# Patient Record
Sex: Female | Born: 1960 | Hispanic: No | Marital: Single | Smoking: Former smoker
Health system: Southern US, Community
[De-identification: ages and names within clinical notes are randomized; demographics above are authoritative.]

## PROBLEM LIST (undated history)

## (undated) DIAGNOSIS — E119 Type 2 diabetes mellitus without complications: Secondary | ICD-10-CM

## (undated) DIAGNOSIS — F329 Major depressive disorder, single episode, unspecified: Secondary | ICD-10-CM

## (undated) DIAGNOSIS — E039 Hypothyroidism, unspecified: Secondary | ICD-10-CM

## (undated) DIAGNOSIS — J45909 Unspecified asthma, uncomplicated: Secondary | ICD-10-CM

## (undated) DIAGNOSIS — F32A Depression, unspecified: Secondary | ICD-10-CM

## (undated) DIAGNOSIS — K589 Irritable bowel syndrome without diarrhea: Secondary | ICD-10-CM

## (undated) DIAGNOSIS — F419 Anxiety disorder, unspecified: Secondary | ICD-10-CM

## (undated) DIAGNOSIS — I10 Essential (primary) hypertension: Secondary | ICD-10-CM

## (undated) DIAGNOSIS — R519 Headache, unspecified: Secondary | ICD-10-CM

## (undated) DIAGNOSIS — F4310 Post-traumatic stress disorder, unspecified: Secondary | ICD-10-CM

## (undated) DIAGNOSIS — R51 Headache: Secondary | ICD-10-CM

## (undated) DIAGNOSIS — T63331A Toxic effect of venom of brown recluse spider, accidental (unintentional), initial encounter: Secondary | ICD-10-CM

## (undated) HISTORY — PX: BACK SURGERY: SHX140

## (undated) HISTORY — DX: Essential (primary) hypertension: I10

## (undated) HISTORY — PX: TONSILLECTOMY: SUR1361

## (undated) HISTORY — PX: ABDOMINAL HYSTERECTOMY: SHX81

## (undated) HISTORY — PX: SPINAL FUSION: SHX223

## (undated) HISTORY — DX: Type 2 diabetes mellitus without complications: E11.9

---

## 2006-05-26 ENCOUNTER — Ambulatory Visit: Payer: Self-pay

## 2006-11-18 ENCOUNTER — Ambulatory Visit: Payer: Self-pay

## 2006-11-18 ENCOUNTER — Other Ambulatory Visit: Payer: Self-pay

## 2006-11-19 ENCOUNTER — Ambulatory Visit: Payer: Self-pay

## 2008-03-14 ENCOUNTER — Encounter: Admission: RE | Admit: 2008-03-14 | Discharge: 2008-03-14 | Payer: Self-pay | Admitting: Internal Medicine

## 2008-06-15 ENCOUNTER — Ambulatory Visit: Payer: Self-pay | Admitting: Family Medicine

## 2009-02-14 ENCOUNTER — Ambulatory Visit: Payer: Self-pay | Admitting: Obstetrics & Gynecology

## 2009-02-15 ENCOUNTER — Ambulatory Visit: Payer: Self-pay | Admitting: Obstetrics & Gynecology

## 2009-09-22 ENCOUNTER — Observation Stay: Payer: Self-pay | Admitting: Internal Medicine

## 2009-10-09 ENCOUNTER — Ambulatory Visit: Payer: Self-pay | Admitting: Family Medicine

## 2010-01-25 ENCOUNTER — Ambulatory Visit (HOSPITAL_COMMUNITY): Admission: RE | Admit: 2010-01-25 | Discharge: 2010-01-26 | Payer: Self-pay | Admitting: Neurological Surgery

## 2010-02-25 ENCOUNTER — Encounter: Admission: RE | Admit: 2010-02-25 | Discharge: 2010-02-25 | Payer: Self-pay | Admitting: Neurological Surgery

## 2010-03-09 ENCOUNTER — Emergency Department: Payer: Self-pay | Admitting: Emergency Medicine

## 2010-06-25 ENCOUNTER — Ambulatory Visit: Payer: Self-pay | Admitting: Otolaryngology

## 2010-07-16 ENCOUNTER — Other Ambulatory Visit: Payer: Self-pay | Admitting: Neurological Surgery

## 2010-07-16 ENCOUNTER — Ambulatory Visit
Admission: RE | Admit: 2010-07-16 | Discharge: 2010-07-16 | Disposition: A | Discharge status: Home / Self Care | Source: Ambulatory Visit | Attending: Neurological Surgery | Admitting: Neurological Surgery

## 2010-07-16 DIAGNOSIS — Z9889 Other specified postprocedural states: Secondary | ICD-10-CM

## 2010-07-25 LAB — BASIC METABOLIC PANEL
CO2: 28 mEq/L (ref 19–32)
Chloride: 105 mEq/L (ref 96–112)
Glucose, Bld: 122 mg/dL — ABNORMAL HIGH (ref 70–99)
Potassium: 4.3 mEq/L (ref 3.5–5.1)
Sodium: 140 mEq/L (ref 135–145)

## 2010-07-25 LAB — CBC
HCT: 41.7 % (ref 36.0–46.0)
Hemoglobin: 13.9 g/dL (ref 12.0–15.0)
MCH: 28.1 pg (ref 26.0–34.0)
MCHC: 33.3 g/dL (ref 30.0–36.0)

## 2010-07-25 LAB — GLUCOSE, CAPILLARY
Glucose-Capillary: 122 mg/dL — ABNORMAL HIGH (ref 70–99)
Glucose-Capillary: 135 mg/dL — ABNORMAL HIGH (ref 70–99)
Glucose-Capillary: 309 mg/dL — ABNORMAL HIGH (ref 70–99)

## 2010-07-25 LAB — PROTIME-INR: INR: 0.94 (ref 0.00–1.49)

## 2010-07-25 LAB — SURGICAL PCR SCREEN
MRSA, PCR: NEGATIVE
Staphylococcus aureus: NEGATIVE

## 2010-08-13 ENCOUNTER — Ambulatory Visit: Payer: Self-pay | Admitting: Family Medicine

## 2010-12-02 ENCOUNTER — Ambulatory Visit
Admission: RE | Admit: 2010-12-02 | Discharge: 2010-12-02 | Disposition: A | Discharge status: Home / Self Care | Source: Ambulatory Visit | Attending: Neurological Surgery | Admitting: Neurological Surgery

## 2010-12-02 ENCOUNTER — Other Ambulatory Visit: Payer: Self-pay | Admitting: Neurological Surgery

## 2010-12-02 DIAGNOSIS — M4712 Other spondylosis with myelopathy, cervical region: Secondary | ICD-10-CM

## 2010-12-02 DIAGNOSIS — M4802 Spinal stenosis, cervical region: Secondary | ICD-10-CM

## 2011-09-02 ENCOUNTER — Observation Stay: Payer: Self-pay | Admitting: Internal Medicine

## 2011-09-02 LAB — URINALYSIS, COMPLETE
Bacteria: NONE SEEN
Bilirubin,UR: NEGATIVE
Blood: NEGATIVE
Leukocyte Esterase: NEGATIVE
Nitrite: NEGATIVE
Ph: 7 (ref 4.5–8.0)
Protein: NEGATIVE
RBC,UR: 1 /HPF (ref 0–5)
Specific Gravity: 1.006 (ref 1.003–1.030)
Squamous Epithelial: 1

## 2011-09-02 LAB — COMPREHENSIVE METABOLIC PANEL
Anion Gap: 8 (ref 7–16)
Bilirubin,Total: 0.4 mg/dL (ref 0.2–1.0)
Calcium, Total: 8.7 mg/dL (ref 8.5–10.1)
Creatinine: 0.91 mg/dL (ref 0.60–1.30)
EGFR (African American): >60
Osmolality: 283 (ref 275–301)
SGPT (ALT): 24 U/L
Sodium: 140 mmol/L (ref 136–145)
Total Protein: 7.7 g/dL (ref 6.4–8.2)

## 2011-09-02 LAB — CBC
HCT: 45.2 % (ref 35.0–47.0)
MCHC: 32.3 g/dL (ref 32.0–36.0)

## 2011-09-02 LAB — CK-MB: CK-MB: 0.7 ng/mL (ref 0.5–3.6)

## 2011-09-02 LAB — CK: CK, Total: 46 U/L (ref 21–215)

## 2011-09-03 DIAGNOSIS — G459 Transient cerebral ischemic attack, unspecified: Secondary | ICD-10-CM

## 2011-09-03 LAB — CK-MB
CK-MB: 0.6 ng/mL (ref 0.5–3.6)
CK-MB: <0.5 ng/mL — ABNORMAL LOW (ref 0.5–3.6)

## 2011-09-03 LAB — TROPONIN I: Troponin-I: <0.02 ng/mL

## 2011-09-03 LAB — CK
CK, Total: 43 U/L (ref 21–215)
CK, Total: 44 U/L (ref 21–215)

## 2011-10-28 ENCOUNTER — Ambulatory Visit: Payer: Self-pay | Admitting: Neurology

## 2012-03-03 IMAGING — CR DG CHEST 1V PORT
1 series · 1 of 1 positions shown · non-contrast
Comparison: none

REASON FOR EXAM: cva
COMMENTS:

PROCEDURE:     DXR - DXR PORTABLE CHEST SINGLE VIEW  - September 22, 2009 [DATE]
RESULT:     Comparison: None

[view not recorded]
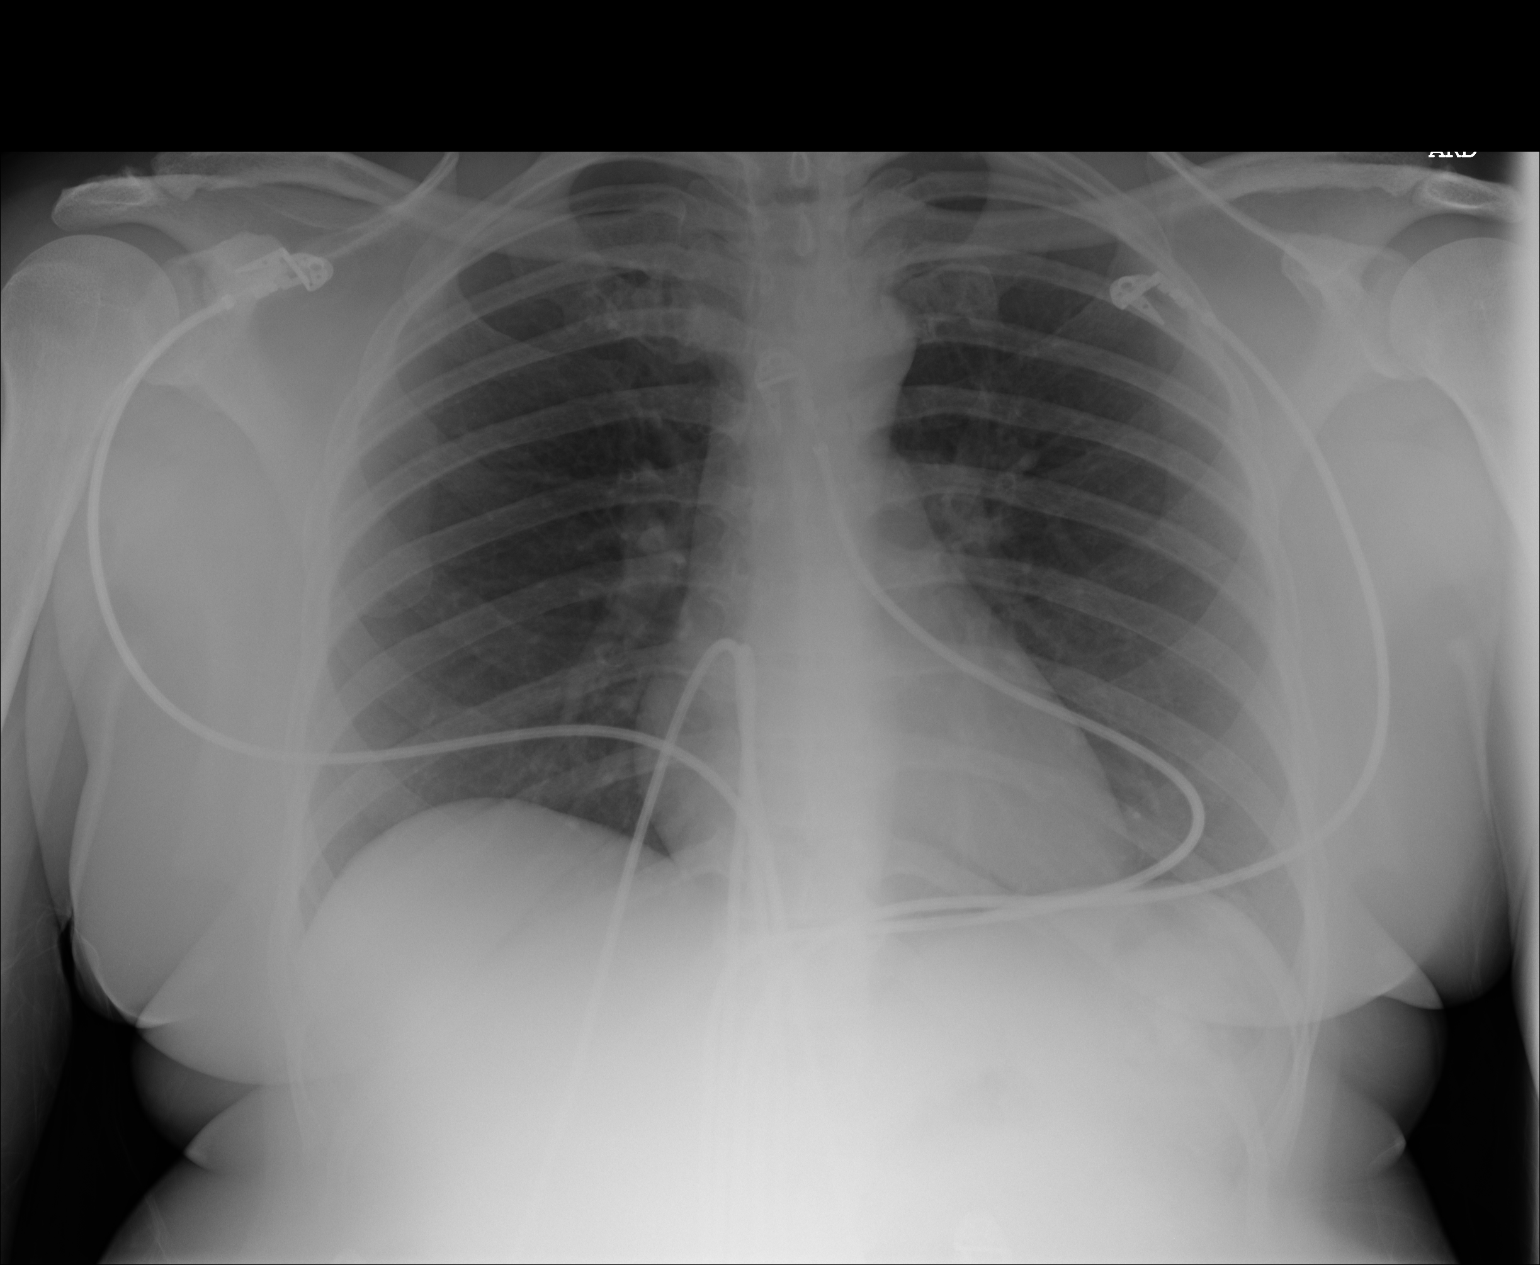

[1 of 1 positions shown; findings below may reference images not displayed]

FINDINGS: Single portable AP chest radiograph is provided. There is no focal
parenchymal opacity, pleural effusion, or pneumothorax. Normal
cardiomediastinal silhouette. The osseous structures are unremarkable.
IMPRESSION: No acute disease of the chest.

## 2012-05-12 DIAGNOSIS — T63331A Toxic effect of venom of brown recluse spider, accidental (unintentional), initial encounter: Secondary | ICD-10-CM

## 2012-05-12 HISTORY — DX: Toxic effect of venom of brown recluse spider, accidental (unintentional), initial encounter: T63.331A

## 2012-06-21 ENCOUNTER — Ambulatory Visit: Payer: Self-pay | Admitting: Neurology

## 2012-07-02 IMAGING — CR DG CHEST 2V
2 series · 2 of 2 positions shown · non-contrast
Comparison: None

CLINICAL DATA: Lumbar spondylosis and stenosis.  Preop respiratory
exam.

CHEST - 2 VIEW

[w chest pa]
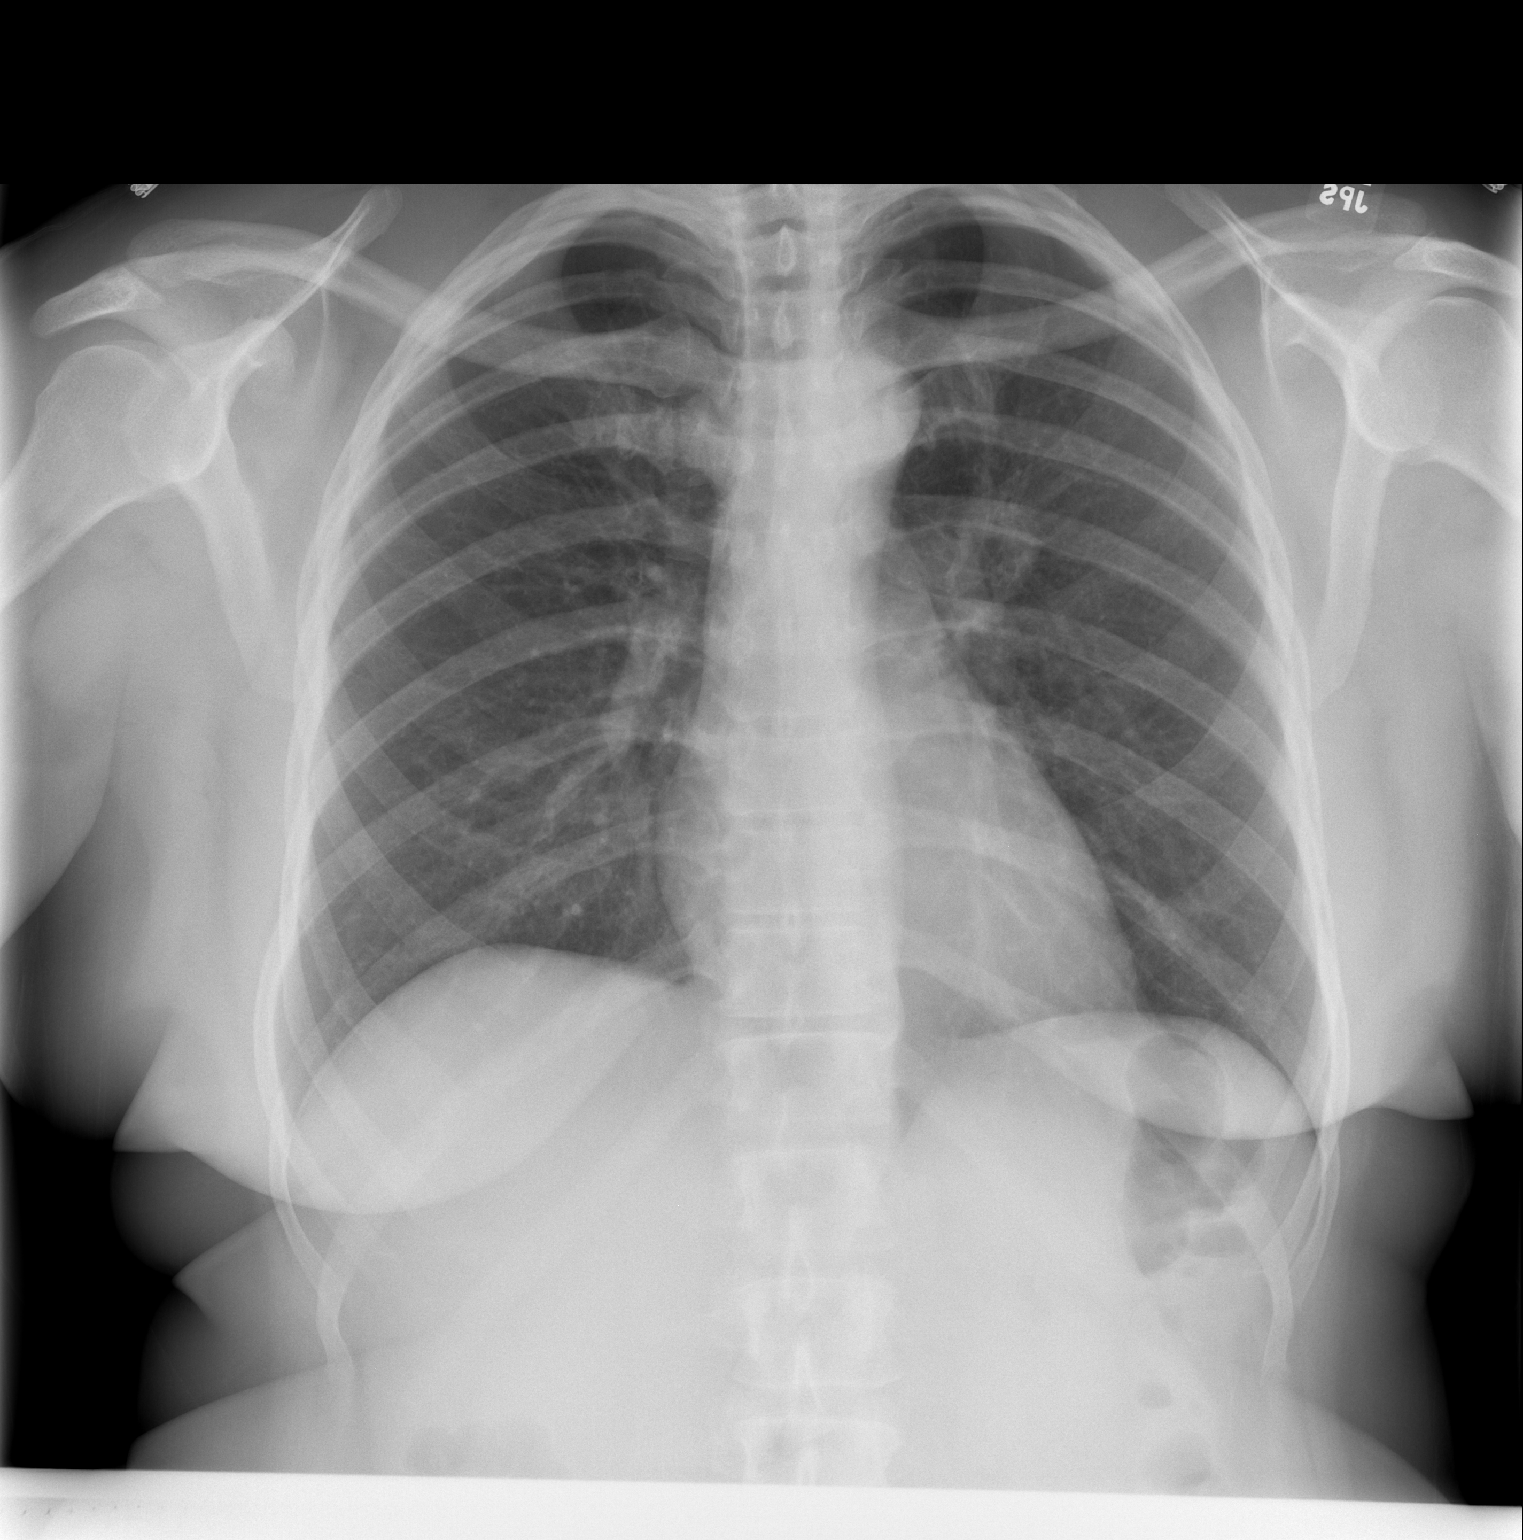

[w chest lat]
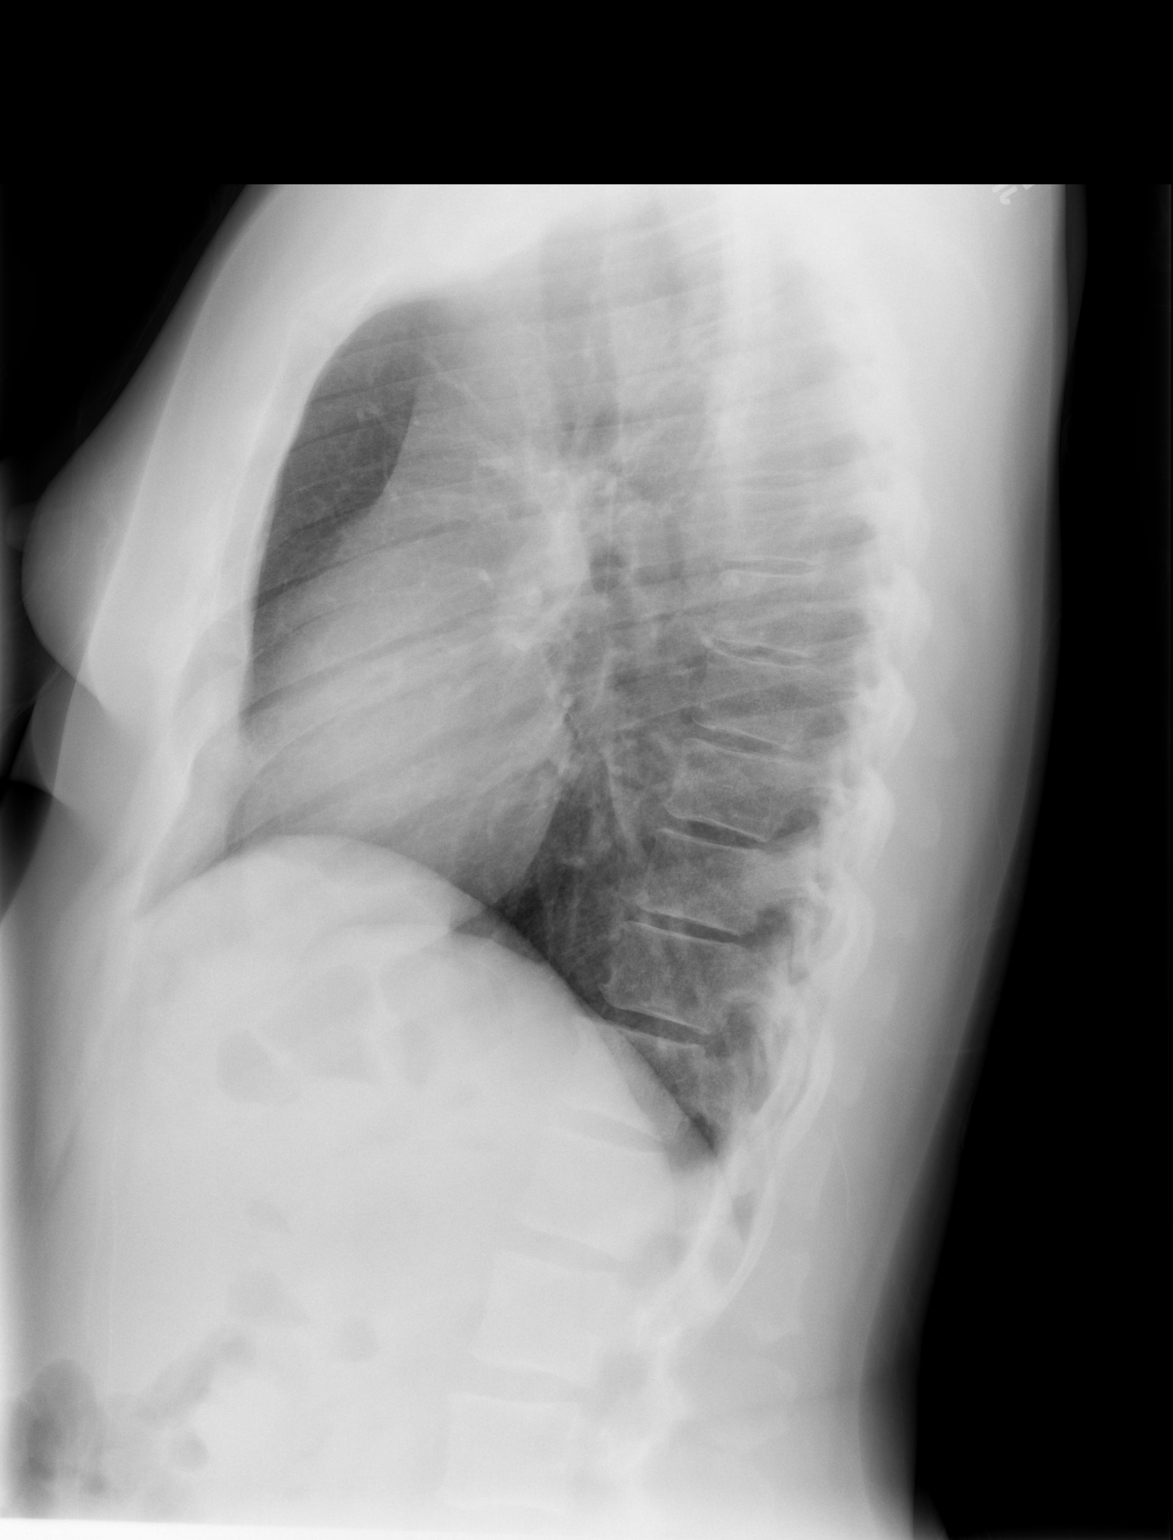

[2 of 2 positions shown; findings below may reference images not displayed]

FINDINGS: The cardiac silhouette, mediastinal and hilar contours
are within normal limits.  The lungs are clear.  The bony thorax is
intact.
IMPRESSION: Normal chest x-ray.

## 2012-08-18 ENCOUNTER — Encounter: Payer: Self-pay | Admitting: Cardiothoracic Surgery

## 2012-08-18 ENCOUNTER — Encounter: Payer: Self-pay | Admitting: Nurse Practitioner

## 2012-09-09 ENCOUNTER — Encounter: Payer: Self-pay | Admitting: Nurse Practitioner

## 2012-09-09 ENCOUNTER — Encounter: Payer: Self-pay | Admitting: Cardiothoracic Surgery

## 2012-10-10 ENCOUNTER — Encounter: Payer: Self-pay | Admitting: Cardiothoracic Surgery

## 2012-10-10 ENCOUNTER — Encounter: Payer: Self-pay | Admitting: Nurse Practitioner

## 2012-10-20 LAB — WOUND CULTURE

## 2012-11-09 ENCOUNTER — Encounter: Payer: Self-pay | Admitting: Nurse Practitioner

## 2012-11-09 ENCOUNTER — Encounter: Payer: Self-pay | Admitting: Cardiothoracic Surgery

## 2012-11-17 LAB — WOUND CULTURE

## 2012-11-22 LAB — WOUND CULTURE

## 2012-11-22 LAB — PATHOLOGY REPORT

## 2012-12-10 ENCOUNTER — Encounter: Payer: Self-pay | Admitting: Nurse Practitioner

## 2012-12-10 ENCOUNTER — Encounter: Payer: Self-pay | Admitting: Cardiothoracic Surgery

## 2014-02-11 IMAGING — CR DG CHEST 1V PORT
1 series · 1 of 1 positions shown · non-contrast
Comparison: none

REASON FOR EXAM: CVA
COMMENTS:

[x chest ap]
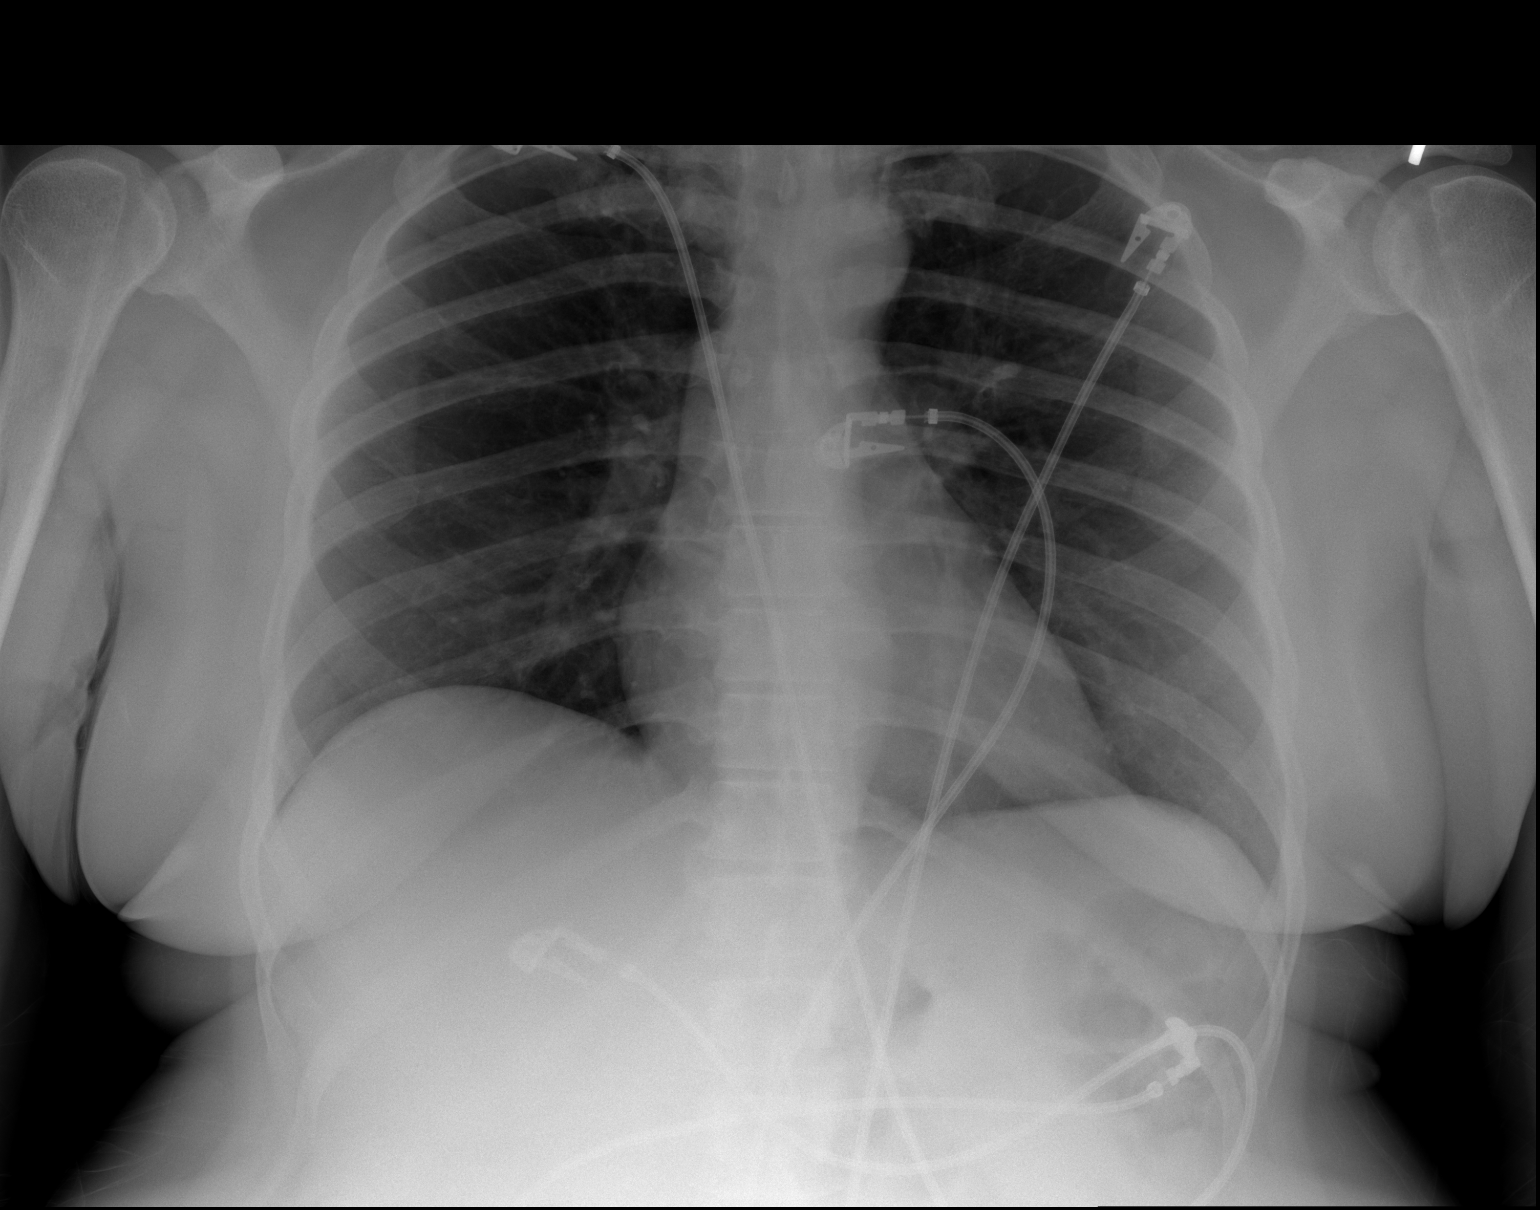

[1 of 1 positions shown; findings below may reference images not displayed]

PROCEDURE:     DXR - DXR PORTABLE CHEST SINGLE VIEW  - September 02, 2011 [DATE]

RESULT:     Comparison is made with previous exam dated 09 March, 2010.

Cardiac monitoring electrodes are present. The lungs are clear. The heart
and pulmonary vessels are normal. The bony and mediastinal structures are
unremarkable. There is no effusion. There is no pneumothorax or evidence of
congestive failure.
IMPRESSION: No acute cardiopulmonary disease.

[REDACTED]

## 2014-09-03 NOTE — Discharge Summary (Signed)
PATIENT NAME:  Margaret Case, Margaret Case MR#:  [id number] DATE OF BIRTH:  05/24/1960  DATE OF ADMISSION:  09/02/2011 DATE OF DISCHARGE:  09/03/2011  PRIMARY CARE PHYSICIAN: Lorie PhenixNancy Maloney, MD  DISCHARGE DIAGNOSIS: Possible partial seizure with right-sided numbness and tingling which has resolved.   SECONDARY DIAGNOSES:  1. Depression.  2. Seasonal allergies. 3. Hypertension.  4. Type 2 diabetes.  5. Hypothyroidism.  6. History of hemorrhoids.   CONSULTATION: Mellody DrownMatthew Smith, MD - Neurology.  PROCEDURES/RADIOLOGY: EEG on 09/03/2011 showed bifrontal spike suggestive of epilepsy.  2-D echocardiogram, on 09/03/2011, showed normal LV systolic function, normal LV wall thickness.   MRI of the brain without contrast, on 09/03/2011, showed no acute intracranial findings. No acute infarct. Paranasal sinus disease.   CT scan of the head without contrast, on 09/02/2011, showed no acute intracranial process.   Chest x-ray, on 09/02/2011, showed no acute cardiopulmonary disease.   MAJOR LABORATORY PANEL: Urinalysis on admission was negative.   HISTORY AND SHORT HOSPITAL COURSE: The patient is a 54 year old female with the above-mentioned medical problems who was admitted for suspected transient ischemic attack. Please see Dr. Jearl KlinefelterSona Patel's dictated history and physical. Neurology consultation was obtained with Dr. Mellody DrownMatthew Smith who felt the event to be more seizure related and recommended an EEG which also confirmed the same. The patient was started on Trileptal. She was doing much better. She had some right-sided numbness and tingling on admission which was resolved while in the hospital and after discussion with neurology the patient was discharged home in stable condition. The patient was in agreement the discharge planning. She had a complete neurological work-up while in the hospital with the results dictated above.   PHYSICAL EXAMINATION:   VITALS: On the date of discharge, her temperature was 98.7,  heart rate 66 per minute, respirations 18 per minute, blood pressure 118/60 mmHg, and she was saturating 98% on room air.   CARDIOVASCULAR: S1 and S2 normal. No murmurs, rubs, or gallops.   LUNGS: Clear to auscultation bilaterally. No wheezes, rales, rhonchi, or crepitation.   ABDOMEN: Soft and benign.   NEUROLOGIC: Nonfocal examination.   All other physical examination remained at baseline.  DISCHARGE MEDICATIONS: 1. Metformin 1000 mg p.o. twice a day. 2. Fexofenadine 180 mg p.o. daily.  3. Asmanex 1 puff inhaled at bedtime.  4. Aspirin 325 mg p.o. daily.  5. Centrum Silver once daily. 6. Lyrica 75 mg p.o. three times daily. 7. Amlodipine 10 mg p.o. daily. 8. Patanol 0.1% ophthalmic solution to both eyes twice a day. 9. Pristiq 50 mg three tablets p.o. daily.  10. Diazepam 5 mg p.o. at bedtime.  11. Zolpidem 10 mg p.o. at bedtime. 12. Buspirone 10 mg p.o. twice a day. 13. Meloxicam 15 mg p.o. daily.  14. Trileptal 300 mg p.o. twice a day for three days followed by 600 mg p.o. twice a day.   DISCHARGE DIET: Low sodium, 1800 ADA, low cholesterol.   DISCHARGE ACTIVITY: As tolerated.   DISCHARGE INSTRUCTIONS AND FOLLOW-UP: The patient was instructed to follow-up with her primary care physician, Dr. Lorie PhenixNancy Maloney, in 1 to 2 weeks. [facility] 2 to 4 weeks.   TOTAL TIME DISCHARGING THIS PATIENT: 55 minutes. ____________________________ Ellamae SiaVipul S. Sherryll BurgerShah, MD vss:slb D: 09/05/2011 01:17:00 ET Case: 09/05/2011 13:12:14 ET JOB#: [id number]  cc: Trenise Turay S. Sherryll BurgerShah, MD, <Dictator> Leo GrosserNancy J. Maloney, MD Troy SineMatthew C. Katrinka BlazingSmith, MD [facility] Ellamae SiaVIPUL S [facility] MD ELECTRONICALLY SIGNED 09/05/2011 21:36

## 2014-09-03 NOTE — Consult Note (Signed)
Referring Physician:  Fritzi Mandes A :   Primary Care Physician:  Ilda Basset [facility], [address] [place], [place] [address], [place] [phone]  Reason for Consult:  Admit Date: 02-Sep-2011   Chief Complaint: R sided numbness and difficulty getting words out   Reason for Consult: CVA   History of Present Illness:  History of Present Illness:   54 yo RHD AAF presents on Tuesday after waking up after greater than 24 hours of sleep and noting that she had a sided headache then noticed L facial numbness that spread to arm and then her leg over the course of minutes.  She also reports difficulty getting her words out and some slurring but this resolved first.  She did note some vision changes and weakness.  Since being admitted, all symptoms have gotten somewhat better but seem to fluctuate.  Her headache is currently gone but the rest of the symptoms persists.  ROS:   General weakness  fatigue    HEENT poor vision especially in R eye    Lungs no complaints    Cardiac no complaints    GI no complaints    GU no complaints    Musculoskeletal no complaints    Extremities no complaints    Skin no complaints    Neuro headache  numbness/tingling    Endocrine no complaints    Psych no complaints   Past Medical/Surgical Hx:  diabetes:   Hemorrhoids:   hypothroid:   depression:   seasonal allergies:   htn:   Tonsillectomy and Adenoidectomy:   Laminectomy  L5, S1:   Past Medical/ Surgical Hx:   Past Medical History as above    Past Surgical History as above   Home Medications: Medication Instructions Last Modified Date/Time  metformin 1000 mg oral tablet 1 tab(s) orally 2 times a day  23-Apr-13 13:16  fexofenadine 180 mg oral tablet 1 tab(s) orally once a day 23-Apr-13 13:16  Asmanex Twisthaler 30 Dose 110 mcg/inh inhalation aerosol powder 1 puff(s) inhaled once a day (at bedtime) 23-Apr-13 13:16  aspirin 325 mg oral tablet 1 tab(s) orally  once a day 23-Apr-13 13:16  Centrum Silver oral tablet 1 tab(s) orally once a day 23-Apr-13 13:16  Lyrica 75 mg oral capsule 1 cap(s) orally 3 times a day 23-Apr-13 13:16  amlodipine 10 mg oral tablet 1 tab(s) orally once a day 23-Apr-13 13:16  Patanol 0.1% ophthalmic solution 1 drop(s) into both eyes 2 times a day 23-Apr-13 13:16  Pristiq 50 mg oral tablet, extended release 3 tab(s) orally once a day 23-Apr-13 13:16  diazepam 5 mg oral tablet 1 tab(s) orally once a day (at bedtime) 23-Apr-13 13:16  zolpidem 10 mg oral tablet 1 tab(s) orally once a day (at bedtime). **brand name ambien** 23-Apr-13 13:16  busPIRone 10 mg oral tablet 1 tab(s) orally 2 times a day 23-Apr-13 13:16  meloxicam 15 mg oral tablet 1 tab(s) orally once a day (in the morning).  **brand name mobic** 23-Apr-13 13:16   Allergies:  Septra: Other  Sulfa drugs: Anaphylaxis  Social/Family History:  Employment Status: currently employed   Lives With: spouse   Living Arrangements: house   Social History: recently quit smoking, no illicits or EtOH   Family History: no seizures   Vital Signs: **Vital Signs.:   24-Apr-13 06:10   Vital Signs Type Q 4hr   Temperature Temperature (F) 98.7   Celsius 37   Pulse Pulse 66   Respirations Respirations 18  Diastolic BP (mmHg) Diastolic BP (mmHg) 60   Mean BP 79   Pulse Ox % Pulse Ox % 98   Pulse Ox Activity Level  At rest   Oxygen Delivery Room Air/ 21 %    07:17   Vital Signs Type POCT   Nurse Fingerstick (mg/dL) FSBS (fasting range 65-99 mg/dL) 166   Comments/Interventions  Received Meds   Physical Exam:  General: resting in no acute distress, slightly overweight   HEENT: normocephalic, sclera nonicteric, oropharynx clear   Neck: supple, no JVD, no bruits   Chest: CTA B, no wheezes, good movement   Cardiac: RRR, no murmurs, 2+ pulses   Extremities: no C/C/E, FROM   Neurologic Exam:  Mental Status: A+Ox4, nl speech and language   Cranial Nerves: PERRLA, EOMI, nl  VF, face symmetric, tongue midline, shoulder shrug equal   Motor Exam: 5/5 B except for drift in R UE, nl tone   Deep Tendon Reflexes: 1+ symmetric, mute B plantars   Sensory Exam: intact to temp, pinprick and vibration   Coordination: FTN and HTS WNL   Cerebellar Exam: nl RAM   Lab Results:  Hepatic:  23-Apr-13 11:49    Bilirubin, Total 0.4   Alkaline Phosphatase 120   SGPT (ALT) 24   SGOT (AST) 18   Total Protein, Serum 7.7   Albumin, Serum 3.9  Routine Chem:  23-Apr-13 11:49    Glucose, Serum   150   BUN 16   Creatinine (comp) 0.91   Sodium, Serum 140   Potassium, Serum 4.0   Chloride, Serum 105   CO2, Serum 27   Calcium (Total), Serum 8.7   Osmolality (calc) 283   eGFR (African American) >60   eGFR (Non-African American) >60   Anion Gap 8  Cardiac:  24-Apr-13 06:28    Troponin I < 0.02   CK, Total 44   CPK-MB, Serum 0.6  Routine UA:  23-Apr-13 15:09    Color (UA) Straw   Clarity (UA) Clear   Glucose (UA) Negative   Bilirubin (UA) Negative   Ketones (UA) Trace   Specific Gravity (UA) 1.006   Blood (UA) Negative   pH (UA) 7.0   Protein (UA) Negative   Nitrite (UA) Negative   Leukocyte Esterase (UA) Negative   RBC (UA) 1 /HPF   WBC (UA) <1 /HPF   Epithelial Cells (UA) 1 /HPF   Mucous (UA) PRESENT  Routine Hem:  23-Apr-13 11:49    WBC (CBC)   13.1   RBC (CBC) 5.06   Hemoglobin (CBC) 14.6   Hematocrit (CBC) 45.2   Platelet Count (CBC) 328   MCV 89   MCH 28.9   MCHC 32.3   RDW 14.3   Radiology Results: CT:    23-Apr-13 12:04, CT Head Without Contrast   CT Head Without Contrast    REASON FOR EXAM:    CVA  COMMENTS:   May transport without cardiac monitor    PROCEDURE: CT  - CT HEAD WITHOUT CONTRAST  - Sep 02 2011 12:04PM     RESULT: Comparison:  09/22/2009    Technique: Multiple axial images from the foramen magnum to the vertex   were obtained without IV contrast.    Findings:    There is no evidence for mass effect, midline shift, or  extra-axial fluid   collections. There is no evidence for space-occupying lesion,   intracranial hemorrhage, or cortical-based area of infarction.     There is a small air fluid level  in the left maxillary sinus. There is   minimal opacification in the left sphenoid sinus.    The osseous structures are unremarkable.    IMPRESSION:    No acute intracranial process.    CT can underestimate ischemia in the first 24 hours after the event. If   there is clinical concern for an acute infarct, a followup MRI or repeat   CT scan in 24 hours may provide additional information.          Verified By: Gregor Hams, M.D., MD   Impression/Recommendations:  Recommendations:   CT scan personally reviewed by me shows no white matter changes or hemorrhages shows normal sinus rhythm was discussed with referring physician  R sided numbness, tingling-  this appears to be the result of a partial seizure that is still occuring because she still has positive phenomena.  Strokes do not usually fluctuate and would not cause the pt to sleep for so long and have a headache as well.  Complicated migraine could cause this but it would be extremely rare without prior history.   Hypertension-  controlled Diabetes mellitus-  slightly elevated here  agree with MRI of brain w/wo contrast needs EEG start Trileptal 344m BID x 3 days, then 6084mBID seizure precautions check hem A1c pt encouraged to continue to stop smoking follow  Electronic Signatures for Addendum Section:  SmJamison NeighborMD) (Signed Addendum 24-Apr-13 13:52)  pt was counseled against driving for 6 months until she sees a neurologist   Electronic Signatures: SmJamison NeighborMD)  (Signed 24-Apr-13 09:47)  Authored: REFERRING PHYSICIAN, Primary Care Physician, Consult, History of Present Illness, Review of Systems, PAST MEDICAL/SURGICAL HISTORY, HOME MEDICATIONS, ALLERGIES, Social/Family History, NURSING VITAL SIGNS, Physical Exam-, LAB  RESULTS, RADIOLOGY RESULTS, Recommendations   Last Updated: 24-Apr-13 13:52 by SmJamison NeighborMD)

## 2014-09-03 NOTE — H&P (Signed)
PATIENT NAME:  Margaret Case, STELLA MR#:  [id number] DATE OF BIRTH:  [birth year]  DATE OF ADMISSION:  09/02/2011  PRIMARY CARE PHYSICIAN: Lorie Phenix, MD   CHIEF COMPLAINT: Right facial and right upper extremity numbness with some expressive aphasia today.   HISTORY OF PRESENT ILLNESS: Margaret Case is a 54 year old African American female who is an Charity fundraiser by occupation, works at [employer], who comes to the Emergency Room with the above-mentioned chief complaint. The patient has history of hypertension and diabetes. She said she went to bed at 11:30 p.m. on Sunday night and woke up this morning, which is more than 24 hours later, complaining of right-sided numbness and dysarthric/expressive aphasia. The patient felt her whole right side was numb, more so her tongue, and felt some heaviness in her head. She checked her blood sugar. It was 124. Her blood pressure was 150/90. Since she did not feel better she came to the Emergency Room. She received aspirin. Her CT of the head was negative for stroke. Medicine consultation was done with Neurology and the patient is recommended to be admitted for further work-up for possible CVA/TIA. The patient definitely feels better as far as her symptoms. Her numbness is now more localized to the right-sided facial numbness. She received full dose aspirin in the Emergency Room. She is being admitted for further evaluation and management. The patient denies any dysphagia or any visual symptoms. She denies any focal motor weakness. She did drive herself to the Emergency Room.  PAST MEDICAL HISTORY: 1. Depression.  2. Seasonal allergies.  3. Hypertension.  4. Type II diabetes.  5. Hypothyroidism.  6. History of hemorrhoids.   PAST SURGICAL HISTORY:  1. Tonsillectomy and adenoidectomy.  2. Laminectomy L5-S1.   ALLERGIES: Septra/sulfa drugs.   CURRENT MEDICATIONS:  1. Amlodipine 10 mg daily.  2. Asmanex 110 mcg per inhaler one puff daily at bedtime.  3. Aspirin 325 mg  p.o. daily.  4. Buspirone 10 mg b.i.d.  5. Centrum Silver p.o. daily.  6. Diazepam 5 mg at bedtime.  7. Lytic 75 mg 1 capsule 3 times a day.   8. Metformin 1000 mg b.i.d.  9. Patanol 0.1% ophthalmic solution one drop both eyes twice a day.  10. Pristiq 50 mg extended-release 3 tablets daily.  11. Zolpidem 10 mg at bedtime.   FAMILY HISTORY: Positive for hypertension.   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 Smokes half a pack a day. Denies any alcohol use.   REVIEW OF SYSTEMS: CONSTITUTIONAL: No fever, fatigue, weakness. EYES: No blurred or double vision. ENT: No tinnitus, ear pain, hearing loss. RESPIRATORY: No cough, wheeze, hemoptysis. CARDIOVASCULAR: No chest pain, orthopnea, edema. GI: No nausea, vomiting, diarrhea, abdominal pain. GU: No dysuria or hematuria. ENDOCRINE: No polyuria or nocturia. HEMATOLOGY: No anemia or easy bruising. SKIN: No acne or rash. MUSCULOSKELETAL: No arthritis. NEUROLOGIC: No CVA or TIA. The patient does have some right facial numbness. Speech clear. PSYCH: No anxiety or depression. All other systems reviewed and negative.    PHYSICAL EXAMINATION:   GENERAL: The patient is awake, alert, and oriented x3 not in acute distress.   VITAL SIGNS: Temperature 96.7, pulse 60, blood pressure 119/82, sats are 97% on room air.   HEENT: Atraumatic, normocephalic. No facial droop. Speech is clear. Pupils equal, round, and reactive to light and accommodation. Extraocular movements intact. Oral mucosa is moist.   NECK: Supple. No JVD. No carotid bruit.   RESPIRATORY: Clear to  auscultation bilaterally. No rales, rhonchi, respiratory distress, or labored breathing.   CARDIOVASCULAR: Both the heart sounds are normal. Rate, rhythm is regular. No murmur heard. PMI not lateralized. Chest nontender. Good pedal pulses. Good femoral pulses. No lower extremity edema.   NEUROLOGIC: The patient is right-handed. Speech clear. No  dysarthria or expressive aphasia. The patient does have some subjective numbness on the right face. She has good motor system exam, good strength 4+/5 in all four extremities. Deep tendon jerks 1+ in both upper and lower extremities. Plantars are downgoing.   PSYCH: The patient is awake, alert, and oriented x3.   SKIN: Warm and dry.   LABORATORY, DIAGNOSTIC, AND RADIOLOGICAL DATA: CT of the head no acute intracranial abnormality.   Chest x-ray no acute cardiopulmonary disease.   CBC within normal limits. Comprehensive metabolic panel within normal limits except glucose of 150.   EKG shows normal sinus rhythm.   ASSESSMENT AND PLAN: 54 year old [name] with:  1. TIA. The patient woke up this a.m. after more than 24 hours of sleep with right-sided numbness with mild dysarthria, difficulty word producing, and symptoms improving in the Emergency Room. The patient received aspirin. CT of the head is negative. Medicine was contacted by the ER MD, not a candidate for thrombolytics. Will admit the patient. Get MRI and echo.  2. Hypertension.  3. Type II diabetes.  4. Depression.  5. Tobacco abuse. Counseled on cessation for about three minutes.   PLAN:  1. Admit the patient for overnight observation.  2. FULL CODE.  3. 2 gram sodium, ADA 1800 calorie diet.  4. Continue all home medications including full dose baby aspirin.  5. Sliding scale insulin.  6. Lovenox 40 mg sub-Q daily.  7. MRI of the brain without contrast.  8. Will get echo Doppler.  9. Cycle cardiac enzymes x3.   10. Activity as tolerated.   Further work-up according to the patient's clinical course. Hospital admission plan was discussed with the patient who is agreeable to it.   TIME SPENT: 50 minutes.   ____________________________ Wylie HailSona A. Allena KatzPatel, MD sap:drc D: 09/02/2011 14:58:18 ET T: 09/02/2011 15:36:34 ET JOB#: [id number]  cc: Johnica Armwood A. Allena KatzPatel, MD, <Dictator> Leo GrosserNancy J. Maloney, MD Willow OraSONA A Nickey Canedo MD ELECTRONICALLY  SIGNED 09/08/2011 23:12

## 2014-10-26 ENCOUNTER — Other Ambulatory Visit: Payer: Self-pay | Admitting: Family Medicine

## 2014-10-26 DIAGNOSIS — E119 Type 2 diabetes mellitus without complications: Secondary | ICD-10-CM

## 2014-10-27 DIAGNOSIS — E119 Type 2 diabetes mellitus without complications: Secondary | ICD-10-CM | POA: Insufficient documentation

## 2015-03-09 ENCOUNTER — Telehealth: Payer: Self-pay | Admitting: Family Medicine

## 2015-03-09 NOTE — Telephone Encounter (Signed)
Margaret Case med called saying she had faxed orders twice for diabetic supplies.  She has not heard anything back.  Could we please check to see if we have recd. and call her back.    CB #  [phone] [id number]  Thanks Barth Kirksteri

## 2015-03-13 ENCOUNTER — Ambulatory Visit (INDEPENDENT_AMBULATORY_CARE_PROVIDER_SITE_OTHER): Payer: Medicare Other | Admitting: Allergy and Immunology

## 2015-03-13 ENCOUNTER — Encounter: Payer: Self-pay | Admitting: Allergy and Immunology

## 2015-03-13 VITALS — BP 132/82 | HR 76 | Temp 97.7°F | Resp 22 | Ht 65.35 in | Wt 187.4 lb

## 2015-03-13 DIAGNOSIS — Z91038 Other insect allergy status: Secondary | ICD-10-CM | POA: Diagnosis not present

## 2015-03-13 DIAGNOSIS — Z91030 Bee allergy status: Secondary | ICD-10-CM

## 2015-03-13 DIAGNOSIS — J4530 Mild persistent asthma, uncomplicated: Secondary | ICD-10-CM | POA: Diagnosis not present

## 2015-03-13 DIAGNOSIS — J3089 Other allergic rhinitis: Secondary | ICD-10-CM

## 2015-03-13 DIAGNOSIS — J309 Allergic rhinitis, unspecified: Secondary | ICD-10-CM

## 2015-03-13 DIAGNOSIS — L259 Unspecified contact dermatitis, unspecified cause: Secondary | ICD-10-CM | POA: Diagnosis not present

## 2015-03-13 DIAGNOSIS — L209 Atopic dermatitis, unspecified: Secondary | ICD-10-CM | POA: Diagnosis not present

## 2015-03-13 MED ORDER — MOMETASONE FUROATE 220 MCG/INH IN AEPB: 2.0000 | INHALATION_SPRAY | RESPIRATORY_TRACT | Status: AC | PRN

## 2015-03-13 MED ORDER — BETAMETHASONE VALERATE 0.1 % EX CREA: TOPICAL_CREAM | Freq: Every day | CUTANEOUS | Status: AC | PRN

## 2015-03-13 MED ORDER — EPINEPHRINE 0.3 MG/0.3ML IJ SOAJ: 0.3000 mg | INTRAMUSCULAR | Status: AC | PRN

## 2015-03-13 MED ORDER — OLOPATADINE HCL 0.2 % OP SOLN: 1.0000 [drp] | OPHTHALMIC | Status: AC

## 2015-03-13 MED ORDER — MONTELUKAST SODIUM 10 MG PO TABS: 10.0000 mg | ORAL_TABLET | Freq: Every day | ORAL | Status: AC

## 2015-03-13 MED ORDER — LORATADINE 10 MG PO TABS: 10.0000 mg | ORAL_TABLET | Freq: Every day | ORAL | Status: AC | PRN

## 2015-03-13 MED ORDER — IPRATROPIUM-ALBUTEROL 18-103 MCG/ACT IN AERO: 1.0000 | INHALATION_SPRAY | Freq: Four times a day (QID) | RESPIRATORY_TRACT | Status: AC | PRN

## 2015-03-13 NOTE — Progress Notes (Signed)
[facility]    NEW PATIENT NOTE  Referring Provider: Vick FreesGuiteau-Laurent, Angeli* Primary Provider: Ned ClinesGUITEAU-LAURENT, ANGELIQUE D, NP    Subjective:   Patient ID: Margaret Case is a 54 y.o. female with a chief complaint of Rash; Allergic Rhinitis ; and Nasal Congestion  and the following problems:  HPI Comments: Margaret SaunasRosemary presents this clinic in evaluation of allergies. Apparently she has a long history of sneezing and nasal congestion and itchy red watery eyes occurring on a point no basis with flare during the winter. She's been treated with nasal steroids with some success. She also has problems with coughing and wheezing and shortness of breath. Since being placed on Asmanex she has had a pretty good resolution of this issue. However, it should also be noted that her improvement comes in the face of discontinuing her tobacco hobby a possibly 3 months ago. She smoked for possibly 35 years. She states that in the past she has developed a pneumonia every year manifested as green sputum production. This usually happens in the winter. She did receive the Pneumovax last month. There are no obvious triggers giving rise to these respiratory tract problems.  Besides for her respiratory tract problems Margaret Case also been having problems with her skin. She has a history of contact dermatitis to perfumes and metals. She developed a rather significant reaction on her skin when being exposed to perfumes. Recently she developed a significant vesicular reaction on her right sternoclavicular area after being sprayed with perfume. She also relates a history of developing some type of inflammatory dermatitis on her arms that's itchy. She's been given a cream which she uses on a pretty consistent basis which helps her. This does not sound as though it is urticarial in nature but rather more inflammatory in nature. There are no obvious triggers giving  rise to these reactions.  Margaret Case underwent a a course of immunotherapy when she was a young girl and also sounds as though she will underwent a course of immunotherapy 6 years ago. The course of immunotherapy 6 years ago definitely did help her and she wants to restart immunotherapy.   Past Medical History  Diagnosis Date  . Diabetes (HCC)   . Hypertension     Past Surgical History  Procedure Laterality Date  . Abdominal hysterectomy    . Spinal fusion    . Tonsillectomy      Outpatient Encounter Prescriptions as of 03/13/2015  Medication Sig  . albuterol (PROVENTIL HFA;VENTOLIN HFA) 108 (90 BASE) MCG/ACT inhaler Inhale 2 puffs into the lungs every 4 (four) hours as needed for wheezing or shortness of breath.  Marland Kitchen. albuterol-ipratropium (COMBIVENT) 18-103 MCG/ACT inhaler Inhale 1 puff into the lungs every 6 (six) hours as needed for wheezing or shortness of breath.  Marland Kitchen. atorvastatin (LIPITOR) 40 MG tablet Take 40 mg by mouth daily.  Marland Kitchen. buPROPion (ZYBAN) 150 MG 12 hr tablet Take 150 mg by mouth 2 (two) times daily.  . busPIRone (BUSPAR) 10 MG tablet Take 10 mg by mouth 2 (two) times daily.  . carboxymethylcellulose (REFRESH PLUS) 0.5 % SOLN 1 drop daily as needed.  . DULoxetine (CYMBALTA) 60 MG capsule Take 60 mg by mouth daily.  Marland Kitchen. EPINEPHrine (EPIPEN 2-PAK) 0.3 mg/0.3 mL IJ SOAJ injection Inject 0.3 mLs (0.3 mg total) into the muscle as needed.  . fluticasone (VERAMYST) 27.5 MCG/SPRAY nasal spray Place 2 sprays into the nose daily.  Marland Kitchen. HYDROcodone-acetaminophen (NORCO) 7.5-325 MG tablet Take 1  tablet by mouth every 6 (six) hours as needed for moderate pain.  . hydrOXYzine (ATARAX/VISTARIL) 50 MG tablet Take 50 mg by mouth every 4 (four) hours as needed.  . insulin glargine (LANTUS) 100 UNIT/ML injection Inject 20 Units into the skin at bedtime.  Marland Kitchen levothyroxine (SYNTHROID, LEVOTHROID) 25 MCG tablet Take 25 mcg by mouth daily before breakfast.  . loratadine (CLARITIN) 10 MG tablet Take 1  tablet (10 mg total) by mouth daily as needed for allergies.  . metFORMIN (GLUMETZA) 1000 MG (MOD) 24 hr tablet Take 1,000 mg by mouth 2 (two) times daily with a meal.  . methocarbamol (ROBAXIN) 750 MG tablet Take 750 mg by mouth every 6 (six) hours as needed for muscle spasms.  . metoprolol succinate (TOPROL-XL) 25 MG 24 hr tablet Take 25 mg by mouth daily.  . mometasone (ASMANEX 60 METERED DOSES) 220 MCG/INH inhaler Inhale 2 puffs into the lungs as needed.  . mometasone (ASMANEX) 220 MCG/INH inhaler Inhale 2 puffs into the lungs daily.  . montelukast (SINGULAIR) 10 MG tablet Take 1 tablet (10 mg total) by mouth at bedtime.  Marland Kitchen omeprazole (PRILOSEC) 20 MG capsule Take 40 mg by mouth daily.  . ondansetron (ZOFRAN) 4 MG tablet Take 4 mg by mouth every 8 (eight) hours as needed for nausea or vomiting.  . pregabalin (LYRICA) 200 MG capsule Take by mouth 3 (three) times daily.  Marland Kitchen topiramate (TOPAMAX) 100 MG tablet Take 100 mg by mouth daily.  Marland Kitchen topiramate (TOPAMAX) 200 MG tablet Take 200 mg by mouth daily.  . [DISCONTINUED] albuterol-ipratropium (COMBIVENT) 18-103 MCG/ACT inhaler Inhale 1 puff into the lungs every 6 (six) hours as needed for wheezing or shortness of breath.  . [DISCONTINUED] EPINEPHrine (EPIPEN 2-PAK) 0.3 mg/0.3 mL IJ SOAJ injection Inject 0.3 mg into the muscle as needed.  . [DISCONTINUED] loratadine (CLARITIN) 10 MG tablet Take 10 mg by mouth daily as needed for allergies.  . [DISCONTINUED] mometasone (ASMANEX 60 METERED DOSES) 220 MCG/INH inhaler Inhale 2 puffs into the lungs as needed.  . [DISCONTINUED] montelukast (SINGULAIR) 10 MG tablet Take 10 mg by mouth at bedtime.  . Amino Acids (DAILY AMINO 6000 PO) Take by mouth.  . betamethasone valerate (VALISONE) 0.1 % cream Apply topically daily as needed.  . busPIRone (BUSPAR) 10 MG tablet Take by mouth 2 (two) times daily.  . clotrimazole (LOTRIMIN) 1 % cream daily.  . furosemide (LASIX) 20 MG tablet Take by mouth as needed.  .  meloxicam (MOBIC) 15 MG tablet Take by mouth daily.  . Olopatadine HCl (PATADAY) 0.2 % SOLN Place 1 drop into both eyes 1 day or 1 dose.  . Polyethylene Glycol 3350-GRX POWD Take by mouth as needed.  . zolpidem (AMBIEN) 10 MG tablet Take by mouth as needed.  . [DISCONTINUED] betamethasone valerate (VALISONE) 0.1 % cream as needed.   No facility-administered encounter medications on file as of 03/13/2015.    Meds ordered this encounter  Medications  . albuterol-ipratropium (COMBIVENT) 18-103 MCG/ACT inhaler    Sig: Inhale 1 puff into the lungs every 6 (six) hours as needed for wheezing or shortness of breath.    Dispense:  3 Inhaler    Refill:  1  . betamethasone valerate (VALISONE) 0.1 % cream    Sig: Apply topically daily as needed.    Dispense:  360 g    Refill:  1  . mometasone (ASMANEX 60 METERED DOSES) 220 MCG/INH inhaler    Sig: Inhale 2 puffs into the lungs as  needed.    Dispense:  3 Inhaler    Refill:  1  . montelukast (SINGULAIR) 10 MG tablet    Sig: Take 1 tablet (10 mg total) by mouth at bedtime.    Dispense:  90 tablet    Refill:  1  . loratadine (CLARITIN) 10 MG tablet    Sig: Take 1 tablet (10 mg total) by mouth daily as needed for allergies.    Dispense:  90 tablet    Refill:  1  . EPINEPHrine (EPIPEN 2-PAK) 0.3 mg/0.3 mL IJ SOAJ injection    Sig: Inject 0.3 mLs (0.3 mg total) into the muscle as needed.    Dispense:  2 Device    Refill:  1  . Olopatadine HCl (PATADAY) 0.2 % SOLN    Sig: Place 1 drop into both eyes 1 day or 1 dose.    Dispense:  3 Bottle    Refill:  1    Allergies  Allergen Reactions  . Sulfa Antibiotics Rash    Review of Systems  Constitutional: Negative for fever and chills.  HENT: Positive for rhinorrhea and sneezing. Negative for congestion, ear pain, facial swelling, nosebleeds, postnasal drip, sinus pressure, sore throat, tinnitus, trouble swallowing and voice change.   Eyes: Positive for itching. Negative for pain, discharge and  redness.  Respiratory: Negative for cough, choking, chest tightness, shortness of breath, wheezing and stridor.   Cardiovascular: Negative for chest pain and leg swelling.  Gastrointestinal: Negative for nausea, vomiting and abdominal pain.  Endocrine: Negative for cold intolerance and heat intolerance.  Genitourinary: Negative for difficulty urinating.  Musculoskeletal: Negative for myalgias and arthralgias.  Allergic/Immunologic: Positive for environmental allergies (History of being stung by a wasp on her toe at the age of 31 with the development of hives, generalized pruritus, and shortness of breath).  Neurological: Positive for headaches (history of migrainespresenting as left sided headache  and feels as though pressure behind associated withnausea and spots in her vision trea. Drinks 1 cup of coffee dip per da). Negative for dizziness.  Hematological: Negative for adenopathy.    Family History  Problem Relation Age of Onset  . Diabetes Mother   . Glaucoma Mother   . Allergic rhinitis Mother   . Diabetes Father   . Hypertension Father   . Allergic rhinitis Father   . Kidney disease Father     Social History   Social History  . Marital Status: Married    Spouse Name: N/A  . Number of Children: N/A  . Years of Education: N/A   Occupational History  . Not on file.   Social History Main Topics  . Smoking status: Former Smoker    Quit date: 11/10/2014  . Smokeless tobacco: Never Used  . Alcohol Use: Not on file  . Drug Use: Not on file  . Sexual Activity: Not on file   Other Topics Concern  . Not on file   Social History Narrative  . No narrative on file    Environmental and Social history  Lives in a house with a slightly damp environment, cats located outside the household, carpeting in the bedroom, no smokers located inside the household, and employment as an [employer] fundraiser. She stopped smoking 3 months ago. She smoked for approximately 35 years   Objective:   Filed  Vitals:   03/13/15 1034  BP: 132/82  Pulse: 76  Temp: 97.7 F (36.5 C)  Resp: 22   Height: 5' 5.35" (166 cm) Weight: 187 lb 6.3 oz (85  kg)  Physical Exam  Constitutional: She appears well-developed and well-nourished. No distress.  HENT:  Head: Normocephalic and atraumatic. Head is without right periorbital erythema and without left periorbital erythema.  Right Ear: Tympanic membrane, external ear and ear canal normal. No drainage or tenderness. No foreign bodies. Tympanic membrane is not injected, not scarred, not perforated, not erythematous, not retracted and not bulging. No middle ear effusion.  Left Ear: Tympanic membrane, external ear and ear canal normal. No drainage or tenderness. No foreign bodies. Tympanic membrane is not injected, not scarred, not perforated, not erythematous, not retracted and not bulging.  No middle ear effusion.  Nose: Nose normal. No mucosal edema, rhinorrhea, nose lacerations or sinus tenderness.  No foreign bodies.  Mouth/Throat: Oropharynx is clear and moist. No oropharyngeal exudate, posterior oropharyngeal edema, posterior oropharyngeal erythema or tonsillar abscesses.  Deviated septum left or right  Eyes: Lids are normal. Right eye exhibits no chemosis, no discharge and no exudate. No foreign body present in the right eye. Left eye exhibits no chemosis, no discharge and no exudate. No foreign body present in the left eye. Right conjunctiva is not injected. Left conjunctiva is not injected.  Neck: Neck supple. No tracheal tenderness present. No tracheal deviation and no edema present. No thyroid mass and no thyromegaly present.  Cardiovascular: Normal rate, regular rhythm, S1 normal and S2 normal.  Exam reveals no gallop.   No murmur heard. Pulmonary/Chest: No accessory muscle usage or stridor. No respiratory distress. She has no wheezes. She has no rhonchi. She has no rales.  Abdominal: Soft. There is no hepatosplenomegaly. There is no tenderness. There  is no rigidity, no rebound and no guarding.  Lymphadenopathy:       Head (right side): No tonsillar adenopathy present.       Head (left side): No tonsillar adenopathy present.    She has no cervical adenopathy.  Neurological: She is alert.  Skin: No rash noted. She is not diaphoretic.  An excoriated slightly erythematous papular lesion approximately 0.5 cm x 1 cm affecting her right clavicular area without any drainage, induration, or tenderness  Psychiatric: She has a normal mood and affect. Her behavior is normal.    Diagnostics:  Allergy skin tests were performed. She demonstrated hypersensitivity against house dust mite, cat, and dog  Spirometry was performed and demonstrated an FEV1 of 2.17 @ 98 % of predicted.  The patient had an Asthma Control Test with the following results:  .  Not completed   Assessment and Plan:    1. Mild persistent asthma, uncomplicated   2. Other allergic rhinitis   3. Hymenoptera allergy   4. Dermatitis, contact   5. Atopic dermatitis      1. Allergen avoidance measures  2. Treat inflammation:   A. Flonase - 1-2 sprays each nostril one time per day  B. Montelukast 10mg  one tablet one time per day  C. Asmanex 220 two inhalations one time per day  3. If needed:   A. Antihistamine  B. pataday one drop each eye one time per day  C. Topical steroid  D. Epi-Pen, benadryl, MD / ER  E. Combivent  4. Immunotherapy  5. Annual fall flu vaccine  6. Blood - hymenoptera venom immunocap panel  7. Return in 4 weeks.  We will start Margaret Case on a course of immunotherapy as a long-term approach to her atopic disease and I will see her back in this clinic and possibly 4 weeks to make sure she is doing well.  We will work through the issue of her Hymenoptera venom by checking a immunocap. She may also need to start a course of immunotherapy directed against Hymenoptera venom pending these results.     Laurette Schimke, MD [facility]

## 2015-03-13 NOTE — Patient Instructions (Addendum)
  1. Allergen avoidance measures  2. Treat inflammation:   A. Flonase - 1-2 sprays each nostril one time per day  B. Montelukast 10mg  one tablet one time per day  C. Asmanex 220 two inhalations one time per day  3. If needed:   A. Antihistamine  B. pataday one drop each eye one time per day  C. Topical steroid  D. Epi-Pen, benadryl, MD / ER  E. Combivent  4. Immunotherapy  5. Annual fall flu vaccine  6. Blood - hymenoptera venom immunocap panel  7. Return in 4 weeks.

## 2015-03-22 ENCOUNTER — Telehealth: Payer: Self-pay | Admitting: Neurology

## 2015-03-22 NOTE — Telephone Encounter (Signed)
Pharmacy called and said that patients insurance wouldn't cover Pataday. Could we send in a prescription for ketotifen eye drops? If so we could fax new prescription to [phone].

## 2015-03-26 NOTE — Telephone Encounter (Signed)
OK to send ketotifin eye drops

## 2015-03-26 NOTE — Telephone Encounter (Signed)
Called and left voicemail for patient to contact us back. Need to find out which pharmacy.

## 2015-03-27 DIAGNOSIS — J3081 Allergic rhinitis due to animal (cat) (dog) hair and dander: Secondary | ICD-10-CM | POA: Diagnosis not present

## 2015-03-28 ENCOUNTER — Other Ambulatory Visit: Payer: Self-pay | Admitting: Allergy and Immunology

## 2015-03-28 DIAGNOSIS — J309 Allergic rhinitis, unspecified: Principal | ICD-10-CM

## 2015-03-28 DIAGNOSIS — H1010 Acute atopic conjunctivitis, unspecified eye: Secondary | ICD-10-CM

## 2015-03-28 DIAGNOSIS — J3089 Other allergic rhinitis: Secondary | ICD-10-CM

## 2015-04-19 ENCOUNTER — Ambulatory Visit: Payer: Medicare Other | Admitting: Podiatry

## 2015-04-30 ENCOUNTER — Ambulatory Visit (INDEPENDENT_AMBULATORY_CARE_PROVIDER_SITE_OTHER): Payer: Medicare Other | Admitting: Podiatry

## 2015-04-30 ENCOUNTER — Encounter: Payer: Self-pay | Admitting: Podiatry

## 2015-04-30 VITALS — BP 126/76 | HR 76 | Resp 12

## 2015-04-30 DIAGNOSIS — M79676 Pain in unspecified toe(s): Secondary | ICD-10-CM

## 2015-04-30 DIAGNOSIS — B351 Tinea unguium: Secondary | ICD-10-CM

## 2015-04-30 DIAGNOSIS — E1142 Type 2 diabetes mellitus with diabetic polyneuropathy: Secondary | ICD-10-CM | POA: Diagnosis not present

## 2015-04-30 NOTE — Progress Notes (Signed)
   Subjective:    Patient ID: Margaret Case, female    DOB: June 16, 1960, [age] y.o.   MRN: [id number]  HPI: She presents today for diabetic checkup. He states that her hemoglobin A1c is somewhere around a 9.0 but she has painful ingrown toenails bilateral hallux. She states that she like to have these margins removed if at all possible. She states that otherwise her toenails are just thick and discolored with dry scaly skin the plantar aspect of the foot.    Review of Systems  Constitutional: Positive for appetite change and unexpected weight change.  HENT: Positive for sinus pressure.   Cardiovascular: Positive for leg swelling.  Gastrointestinal: Positive for nausea, abdominal pain and diarrhea.  Musculoskeletal: Positive for back pain.  Skin: Positive for color change.  Hematological: Bruises/bleeds easily.       Objective:   Physical Exam objective: Vital signs stable alert and oriented 3. Pulses are strongly palpable was slightly diminished pulse pressure bilateral DPs. Neurologic sensorium is diminished per Semmes-Weinstein monofilament and vibratory sensation. Mild flexible hammertoe deformities finally to be deformities. Toenails are sharply incurvated no erythema edema cellulitis drainage or odor toenails are thick yellow dystrophic clinic mycotic. No skin lesions no skin breakdown. Mild xerotic skin plantar aspect bilateral foot.      Assessment & Plan:  Assessment: Primary finding is diabetic peripheral neuropathy. Onychomycosis bilateral.  Plan: Debridement of nails 1 through 5 bilateral. Paperwork was filled out to initiate services regarding diabetic shoes. Otherwise follow-up with her in 3 months.

## 2015-05-01 ENCOUNTER — Ambulatory Visit: Payer: Medicare Other | Admitting: Podiatry

## 2015-05-17 DIAGNOSIS — J3089 Other allergic rhinitis: Secondary | ICD-10-CM | POA: Diagnosis not present

## 2015-06-30 ENCOUNTER — Other Ambulatory Visit: Payer: Self-pay | Admitting: Family Medicine

## 2015-06-30 DIAGNOSIS — E039 Hypothyroidism, unspecified: Secondary | ICD-10-CM

## 2015-07-02 DIAGNOSIS — E039 Hypothyroidism, unspecified: Secondary | ICD-10-CM | POA: Insufficient documentation

## 2015-07-02 DIAGNOSIS — F4320 Adjustment disorder, unspecified: Secondary | ICD-10-CM | POA: Insufficient documentation

## 2015-07-02 DIAGNOSIS — J45909 Unspecified asthma, uncomplicated: Secondary | ICD-10-CM | POA: Insufficient documentation

## 2015-07-02 DIAGNOSIS — J309 Allergic rhinitis, unspecified: Secondary | ICD-10-CM | POA: Insufficient documentation

## 2015-07-02 NOTE — Telephone Encounter (Signed)
Last OV 07/2014  Thanks,   -Laura  

## 2015-07-11 ENCOUNTER — Ambulatory Visit (INDEPENDENT_AMBULATORY_CARE_PROVIDER_SITE_OTHER): Payer: Medicare Other | Admitting: *Deleted

## 2015-07-11 DIAGNOSIS — E1142 Type 2 diabetes mellitus with diabetic polyneuropathy: Secondary | ICD-10-CM

## 2015-07-11 NOTE — Progress Notes (Signed)
Measured for diabetic shoes and insoles. 

## 2015-07-25 ENCOUNTER — Other Ambulatory Visit: Payer: Self-pay | Admitting: Family Medicine

## 2015-07-25 DIAGNOSIS — E119 Type 2 diabetes mellitus without complications: Secondary | ICD-10-CM

## 2015-07-30 ENCOUNTER — Encounter: Payer: Self-pay | Admitting: Podiatry

## 2015-07-30 ENCOUNTER — Ambulatory Visit (INDEPENDENT_AMBULATORY_CARE_PROVIDER_SITE_OTHER): Payer: Medicare Other | Admitting: Podiatry

## 2015-07-30 DIAGNOSIS — E1142 Type 2 diabetes mellitus with diabetic polyneuropathy: Secondary | ICD-10-CM | POA: Diagnosis not present

## 2015-07-30 DIAGNOSIS — M79676 Pain in unspecified toe(s): Secondary | ICD-10-CM

## 2015-07-30 DIAGNOSIS — B351 Tinea unguium: Secondary | ICD-10-CM

## 2015-07-30 NOTE — Progress Notes (Signed)
She presents today with chief complaint of painful elongated toenails. Since she is diabetic with neuropathy.  Objective: vital signs are stable alert and oriented 3.  Toenails are a thick yellow dystrophic sharply incurvated painful palpation as well as debridement.  Assessment: pain limb secondary to onychomycosis 1 through 5 bilateral.   Plan: Discussed etiology pathology conservative versus surgical therapies. Debridement of toenails 1 through 5 bilateral.

## 2015-08-07 ENCOUNTER — Other Ambulatory Visit: Payer: Self-pay | Admitting: Family Medicine

## 2015-08-07 DIAGNOSIS — E119 Type 2 diabetes mellitus without complications: Secondary | ICD-10-CM

## 2015-08-07 NOTE — Telephone Encounter (Signed)
Pt has not been seen in over 1 year, and A1C at that OV was 9.7%. Allene DillonEmily Drozdowski, CMA

## 2015-08-15 ENCOUNTER — Ambulatory Visit (INDEPENDENT_AMBULATORY_CARE_PROVIDER_SITE_OTHER): Payer: Medicare Other | Admitting: *Deleted

## 2015-08-15 DIAGNOSIS — B351 Tinea unguium: Secondary | ICD-10-CM

## 2015-08-15 DIAGNOSIS — E1142 Type 2 diabetes mellitus with diabetic polyneuropathy: Secondary | ICD-10-CM | POA: Diagnosis not present

## 2015-08-15 DIAGNOSIS — M79676 Pain in unspecified toe(s): Secondary | ICD-10-CM

## 2015-08-16 NOTE — Progress Notes (Signed)
Dispensed diabetic shoes and 3 pairs of insoles. Instructions were reviewed and a copy was given to the patient. Patient to reappointment for regularly scheduled diabetic foot care visits or if she experiences any trouble with her diabetic shoes. 

## 2015-08-20 ENCOUNTER — Encounter
Admission: RE | Admit: 2015-08-20 | Discharge: 2015-08-20 | Disposition: A | Discharge status: Home / Self Care | Payer: Medicare Other | Source: Ambulatory Visit | Attending: Obstetrics & Gynecology | Admitting: Obstetrics & Gynecology

## 2015-08-20 DIAGNOSIS — Z01812 Encounter for preprocedural laboratory examination: Secondary | ICD-10-CM | POA: Insufficient documentation

## 2015-08-20 HISTORY — DX: Anxiety disorder, unspecified: F41.9

## 2015-08-20 HISTORY — DX: Depression, unspecified: F32.A

## 2015-08-20 HISTORY — DX: Hypothyroidism, unspecified: E03.9

## 2015-08-20 HISTORY — DX: Unspecified asthma, uncomplicated: J45.909

## 2015-08-20 HISTORY — DX: Major depressive disorder, single episode, unspecified: F32.9

## 2015-08-20 HISTORY — DX: Headache, unspecified: R51.9

## 2015-08-20 HISTORY — DX: Irritable bowel syndrome, unspecified: K58.9

## 2015-08-20 HISTORY — DX: Post-traumatic stress disorder, unspecified: F43.10

## 2015-08-20 HISTORY — DX: Headache: R51

## 2015-08-20 LAB — CBC
HCT: 42.3 % (ref 35.0–47.0)
Hemoglobin: 13.8 g/dL (ref 12.0–16.0)
MCH: 28.3 pg (ref 26.0–34.0)
MCHC: 32.6 g/dL (ref 32.0–36.0)
MCV: 86.8 fL (ref 80.0–100.0)
PLATELETS: 261 10*3/uL (ref 150–440)
RBC: 4.87 MIL/uL (ref 3.80–5.20)
RDW: 14.8 % — AB (ref 11.5–14.5)
WBC: 11 10*3/uL (ref 3.6–11.0)

## 2015-08-20 LAB — TYPE AND SCREEN
ABO/RH(D): B POS
Antibody Screen: NEGATIVE

## 2015-08-20 LAB — BASIC METABOLIC PANEL
ANION GAP: 5 (ref 5–15)
BUN: 15 mg/dL (ref 6–20)
CO2: 25 mmol/L (ref 22–32)
Calcium: 9.9 mg/dL (ref 8.9–10.3)
Chloride: 109 mmol/L (ref 101–111)
Creatinine, Ser: 0.86 mg/dL (ref 0.44–1.00)
Glucose, Bld: 110 mg/dL — ABNORMAL HIGH (ref 65–99)
POTASSIUM: 3.6 mmol/L (ref 3.5–5.1)
SODIUM: 139 mmol/L (ref 135–145)

## 2015-08-20 LAB — PROTIME-INR
INR: 0.95
Prothrombin Time: 12.9 seconds (ref 11.4–15.0)

## 2015-08-20 LAB — APTT: aPTT: 29 seconds (ref 24–36)

## 2015-08-20 NOTE — Patient Instructions (Addendum)
  Your procedure is scheduled on: Tuesday September 04, 2015. Report to Same Day Surgery. To find out your arrival time please call [phone] between 1PM - 3PM on Monday September 03, 2015.  Remember: Instructions that are not followed completely may result in serious medical risk, up to and including death, or upon the discretion of your surgeon and anesthesiologist your surgery may need to be rescheduled.    _x___ 1. Do not eat food or drink liquids after midnight. No gum chewing or hard candies.     ____ 2. No Alcohol for 24 hours before or after surgery.   ____ 3. Bring all medications with you on the day of surgery if instructed.    __x__ 4. Notify your doctor if there is any change in your medical condition     (cold, fever, infections).     Do not wear jewelry, make-up, hairpins, clips or nail polish.  Do not wear lotions, powders, or perfumes. You may wear deodorant.  Do not shave 48 hours prior to surgery. Men may shave face and neck.  Do not bring valuables to the hospital.    [facility] is not responsible for any belongings or valuables.               Contacts, dentures or bridgework may not be worn into surgery.  Leave your suitcase in the car. After surgery it may be brought to your room.  For patients admitted to the hospital, discharge time is determined by your treatment team.   Patients discharged the day of surgery will not be allowed to drive home.    Please read over the following fact sheets that you were given:   [facility]  _x___ Take these medicines the morning of surgery with A SIP OF WATER:    1. busPIRone (BUSPAR) optional  2. levothyroxine (SYNTHROID, LEVOTHROID  3. lisinopril (PRINIVIL,ZESTRIL)  4. omeprazole (PRILOSEC) an extra dose am of surgery  5.pregabalin (LYRICA  6.topiramate (TOPAMAX)  ____ Fleet Enema (as directed)   __x__ Use CHG Soap as directed on instruction sheet  _x__ Use inhalers on the day of surgery and  bring to hospital day of surgery  _x_ Stop metformin 2 days prior to surgery    ____ Take 1/2 of usual insulin dose the night before surgery and none on the morning of          surgery.   ____ Stop Coumadin/Plavix/aspirin on does not apply   __x__ Stop Anti-inflammatories such as Mobic, Advil, Aleve, Ibuprofen, Motrin, Naproxen, Naprosyn, Goodies powders or aspirin products. OK to take Tylenol for pain.   __x__ Stop supplements vitamin D 7 days prior to surgery.    ____ Bring C-Pap to the hospital.

## 2015-08-21 LAB — ABO/RH: ABO/RH(D): B POS

## 2015-09-26 ENCOUNTER — Ambulatory Visit (INDEPENDENT_AMBULATORY_CARE_PROVIDER_SITE_OTHER): Payer: Medicare Other | Admitting: Podiatry

## 2015-09-26 ENCOUNTER — Encounter: Payer: Self-pay | Admitting: Podiatry

## 2015-09-26 DIAGNOSIS — E1142 Type 2 diabetes mellitus with diabetic polyneuropathy: Secondary | ICD-10-CM

## 2015-09-26 NOTE — Progress Notes (Signed)
Patient states that her diabetic shoes were too small. Sending back A200W size 10 wide and reordering A300W size 10.5 wide. Will keep inserts in office to try out with new shoes once they arrive. Will contact pt once they come in.

## 2015-10-31 ENCOUNTER — Inpatient Hospital Stay: Admission: RE | Admit: 2015-10-31 | Payer: Medicare Other | Source: Ambulatory Visit

## 2015-11-05 ENCOUNTER — Ambulatory Visit (INDEPENDENT_AMBULATORY_CARE_PROVIDER_SITE_OTHER): Payer: Medicare Other | Admitting: Podiatry

## 2015-11-05 ENCOUNTER — Encounter
Admission: RE | Admit: 2015-11-05 | Discharge: 2015-11-05 | Disposition: A | Discharge status: Home / Self Care | Payer: Medicare Other | Source: Ambulatory Visit | Attending: Obstetrics & Gynecology | Admitting: Obstetrics & Gynecology

## 2015-11-05 ENCOUNTER — Encounter: Payer: Self-pay | Admitting: Podiatry

## 2015-11-05 DIAGNOSIS — I10 Essential (primary) hypertension: Secondary | ICD-10-CM | POA: Diagnosis not present

## 2015-11-05 DIAGNOSIS — B351 Tinea unguium: Secondary | ICD-10-CM

## 2015-11-05 DIAGNOSIS — E1142 Type 2 diabetes mellitus with diabetic polyneuropathy: Secondary | ICD-10-CM | POA: Diagnosis not present

## 2015-11-05 DIAGNOSIS — F4310 Post-traumatic stress disorder, unspecified: Secondary | ICD-10-CM | POA: Diagnosis not present

## 2015-11-05 DIAGNOSIS — E119 Type 2 diabetes mellitus without complications: Secondary | ICD-10-CM | POA: Diagnosis not present

## 2015-11-05 DIAGNOSIS — N816 Rectocele: Secondary | ICD-10-CM | POA: Diagnosis present

## 2015-11-05 DIAGNOSIS — K589 Irritable bowel syndrome without diarrhea: Secondary | ICD-10-CM | POA: Diagnosis not present

## 2015-11-05 DIAGNOSIS — F329 Major depressive disorder, single episode, unspecified: Secondary | ICD-10-CM | POA: Diagnosis not present

## 2015-11-05 DIAGNOSIS — J45909 Unspecified asthma, uncomplicated: Secondary | ICD-10-CM | POA: Diagnosis not present

## 2015-11-05 DIAGNOSIS — F419 Anxiety disorder, unspecified: Secondary | ICD-10-CM | POA: Diagnosis not present

## 2015-11-05 DIAGNOSIS — Z87891 Personal history of nicotine dependence: Secondary | ICD-10-CM | POA: Diagnosis not present

## 2015-11-05 DIAGNOSIS — M1990 Unspecified osteoarthritis, unspecified site: Secondary | ICD-10-CM | POA: Diagnosis not present

## 2015-11-05 DIAGNOSIS — M79676 Pain in unspecified toe(s): Secondary | ICD-10-CM

## 2015-11-05 DIAGNOSIS — N8110 Cystocele, unspecified: Secondary | ICD-10-CM | POA: Diagnosis present

## 2015-11-05 HISTORY — DX: Toxic effect of venom of brown recluse spider, accidental (unintentional), initial encounter: T63.331A

## 2015-11-05 LAB — CBC
HEMATOCRIT: 37 % (ref 35.0–47.0)
Hemoglobin: 12.7 g/dL (ref 12.0–16.0)
MCH: 29.4 pg (ref 26.0–34.0)
MCHC: 34.3 g/dL (ref 32.0–36.0)
MCV: 85.9 fL (ref 80.0–100.0)
Platelets: 263 10*3/uL (ref 150–440)
RBC: 4.31 MIL/uL (ref 3.80–5.20)
RDW: 14.3 % (ref 11.5–14.5)
WBC: 9.1 10*3/uL (ref 3.6–11.0)

## 2015-11-05 LAB — TYPE AND SCREEN
ABO/RH(D): B POS
Antibody Screen: NEGATIVE

## 2015-11-05 LAB — APTT: aPTT: 29 seconds (ref 24–36)

## 2015-11-05 LAB — PROTIME-INR
INR: 0.93
Prothrombin Time: 12.7 seconds (ref 11.4–15.0)

## 2015-11-05 NOTE — Patient Instructions (Signed)
  Your procedure is scheduled JX:BJYNWGNFon:tomorrow June 27 , 2017. Report to Same Day Surgery. To find out your arrival time please call [phone] between 1PM - 3PM on this afternoon..  Remember: Instructions that are not followed completely may result in serious medical risk, up to and including death, or upon the discretion of your surgeon and anesthesiologist your surgery may need to be rescheduled.    _x___ 1. Do not eat food or drink liquids after midnight. No gum chewing or hard candies.     _x___ 2. No Alcohol for 24 hours before or after surgery.   ____ 3. Bring all medications with you on the day of surgery if instructed.    __x__ 4. Notify your doctor if there is any change in your medical condition     (cold, fever, infections).     Do not wear jewelry, make-up, hairpins, clips or nail polish.  Do not wear lotions, powders, or perfumes. You may wear deodorant.  Do not shave 48 hours prior to surgery. Men may shave face and neck.  Do not bring valuables to the hospital.    [facility] is not responsible for any belongings or valuables.               Contacts, dentures or bridgework may not be worn into surgery.  Leave your suitcase in the car. After surgery it may be brought to your room.  For patients admitted to the hospital, discharge time is determined by your treatment team.   Patients discharged the day of surgery will not be allowed to drive home.    Please read over the following fact sheets that you were given:   [facility]  __x__ Take these medicines the morning of surgery with A SIP OF WATER:    1. busPIRone (BUSPAR)  2. levothyroxine (SYNTHROID, LEVOTHROID)  3. lisinopril (PRINIVIL,ZESTRIL)  4. methocarbamol (ROBAXIN) optional  5. omeprazole (PRILOSEC)  6. pregabalin (LYRICA)  ____ Fleet Enema (as directed)   __x__ Use CHG Soap as directed on instruction sheet  _x___ Use inhalers on the day of surgery and bring to hospital day of  surgery  _x___ Stop metformin 2 days prior to surgery    _x___ Take 1/2 of usual insulin dose the night before surgery and none on the morning of  surgery.   ____ Stop Coumadin/Plavix/aspirin on does not apply.  _x___ Stop Anti-inflammatories such as Mobic, Advil, Aleve, Ibuprofen, Motrin, Naproxen,Naprosyn, Goodies powders or aspirin products.   _x___ Stop supplements until after surgery.    ____ Bring C-Pap to the hospital.

## 2015-11-05 NOTE — Progress Notes (Signed)
She presents today for follow-up of her elongated toenails. She states her diabetes is under good control.  Objective: Pulses are strongly palpable. Neurologic sensorium is intact. Toenails are thick yellow dystrophic with mycotic painful palpation no open lesions or wounds are noted.  Assessment: Pain in limb secondary to onychomycosis.  Plan: Debridement toenails 1 through 5 bilateral covered service secondary to pain.

## 2015-11-06 ENCOUNTER — Ambulatory Visit: Payer: Medicare Other | Admitting: Certified Registered"

## 2015-11-06 ENCOUNTER — Ambulatory Visit
Admission: RE | Admit: 2015-11-06 | Discharge: 2015-11-06 | Disposition: A | Discharge status: Home / Self Care | Payer: Medicare Other | Source: Ambulatory Visit | Attending: Obstetrics & Gynecology | Admitting: Obstetrics & Gynecology

## 2015-11-06 ENCOUNTER — Other Ambulatory Visit: Payer: Self-pay | Admitting: Family Medicine

## 2015-11-06 ENCOUNTER — Encounter
Admission: RE | Disposition: A | Discharge status: Home / Self Care | Payer: Self-pay | Source: Ambulatory Visit | Attending: Obstetrics & Gynecology

## 2015-11-06 ENCOUNTER — Encounter: Payer: Self-pay | Admitting: Anesthesiology

## 2015-11-06 DIAGNOSIS — Z87891 Personal history of nicotine dependence: Secondary | ICD-10-CM | POA: Insufficient documentation

## 2015-11-06 DIAGNOSIS — I10 Essential (primary) hypertension: Secondary | ICD-10-CM | POA: Insufficient documentation

## 2015-11-06 DIAGNOSIS — M1990 Unspecified osteoarthritis, unspecified site: Secondary | ICD-10-CM | POA: Insufficient documentation

## 2015-11-06 DIAGNOSIS — N8110 Cystocele, unspecified: Secondary | ICD-10-CM | POA: Insufficient documentation

## 2015-11-06 DIAGNOSIS — E119 Type 2 diabetes mellitus without complications: Secondary | ICD-10-CM | POA: Insufficient documentation

## 2015-11-06 DIAGNOSIS — IMO0002 Reserved for concepts with insufficient information to code with codable children: Secondary | ICD-10-CM | POA: Diagnosis present

## 2015-11-06 DIAGNOSIS — F419 Anxiety disorder, unspecified: Secondary | ICD-10-CM | POA: Insufficient documentation

## 2015-11-06 DIAGNOSIS — J45909 Unspecified asthma, uncomplicated: Secondary | ICD-10-CM | POA: Insufficient documentation

## 2015-11-06 DIAGNOSIS — N816 Rectocele: Secondary | ICD-10-CM | POA: Diagnosis present

## 2015-11-06 DIAGNOSIS — F4310 Post-traumatic stress disorder, unspecified: Secondary | ICD-10-CM | POA: Insufficient documentation

## 2015-11-06 DIAGNOSIS — F329 Major depressive disorder, single episode, unspecified: Secondary | ICD-10-CM | POA: Insufficient documentation

## 2015-11-06 DIAGNOSIS — K589 Irritable bowel syndrome without diarrhea: Secondary | ICD-10-CM | POA: Insufficient documentation

## 2015-11-06 HISTORY — PX: ANTERIOR AND POSTERIOR REPAIR: SHX5121

## 2015-11-06 LAB — BASIC METABOLIC PANEL
ANION GAP: 5 (ref 5–15)
BUN: 19 mg/dL (ref 6–20)
CO2: 23 mmol/L (ref 22–32)
CREATININE: 1.02 mg/dL — AB (ref 0.44–1.00)
Calcium: 9 mg/dL (ref 8.9–10.3)
Chloride: 114 mmol/L — ABNORMAL HIGH (ref 101–111)
GFR calc Af Amer: >60 mL/min (ref >60)
GFR calc non Af Amer: >60 mL/min (ref >60)
GLUCOSE: 141 mg/dL — AB (ref 65–99)
POTASSIUM: 4.2 mmol/L (ref 3.5–5.1)
SODIUM: 142 mmol/L (ref 135–145)

## 2015-11-06 LAB — GLUCOSE, CAPILLARY: Glucose-Capillary: 122 mg/dL — ABNORMAL HIGH (ref 65–99)

## 2015-11-06 SURGERY — ANTERIOR (CYSTOCELE) AND POSTERIOR REPAIR (RECTOCELE)
Anesthesia: General | Wound class: Clean Contaminated

## 2015-11-06 MED ORDER — GLYCOPYRROLATE 0.2 MG/ML IJ SOLN
INTRAMUSCULAR | Status: DC | PRN
  Administered 2015-11-06: .6 mg via INTRAVENOUS

## 2015-11-06 MED ORDER — KETOROLAC TROMETHAMINE 30 MG/ML IJ SOLN
30.0000 mg | Freq: Four times a day (QID) | INTRAMUSCULAR | Status: DC
  Administered 2015-11-06: 30 mg via INTRAVENOUS

## 2015-11-06 MED ORDER — DEXAMETHASONE SODIUM PHOSPHATE 10 MG/ML IJ SOLN
INTRAMUSCULAR | Status: DC | PRN
  Administered 2015-11-06: 10 mg via INTRAVENOUS

## 2015-11-06 MED ORDER — NEOSTIGMINE METHYLSULFATE 10 MG/10ML IV SOLN
INTRAVENOUS | Status: DC | PRN
  Administered 2015-11-06: 4 mg via INTRAVENOUS

## 2015-11-06 MED ORDER — LIDOCAINE-EPINEPHRINE 1 %-1:100000 IJ SOLN
INTRAMUSCULAR | Status: AC
  Filled 2015-11-06: qty 2

## 2015-11-06 MED ORDER — FENTANYL CITRATE (PF) 100 MCG/2ML IJ SOLN
INTRAMUSCULAR | Status: DC | PRN
  Administered 2015-11-06: 50 ug via INTRAVENOUS

## 2015-11-06 MED ORDER — MIDAZOLAM HCL 2 MG/2ML IJ SOLN
INTRAMUSCULAR | Status: DC | PRN
  Administered 2015-11-06: 2 mg via INTRAVENOUS

## 2015-11-06 MED ORDER — CEFAZOLIN SODIUM-DEXTROSE 2-4 GM/100ML-% IV SOLN
INTRAVENOUS | Status: AC
  Administered 2015-11-06: 2 g via INTRAVENOUS
  Filled 2015-11-06: qty 100

## 2015-11-06 MED ORDER — LIDOCAINE HCL (PF) 1 % IJ SOLN
INTRAMUSCULAR | Status: AC
  Filled 2015-11-06: qty 30

## 2015-11-06 MED ORDER — SULFANILAMIDE 15 % VA CREA
TOPICAL_CREAM | VAGINAL | Status: AC
  Filled 2015-11-06: qty 120

## 2015-11-06 MED ORDER — CEFAZOLIN SODIUM-DEXTROSE 2-4 GM/100ML-% IV SOLN
2.0000 g | Freq: Once | INTRAVENOUS | Status: AC
  Administered 2015-11-06: 2 g via INTRAVENOUS

## 2015-11-06 MED ORDER — ROCURONIUM BROMIDE 100 MG/10ML IV SOLN
INTRAVENOUS | Status: DC | PRN
  Administered 2015-11-06: 40 mg via INTRAVENOUS

## 2015-11-06 MED ORDER — LIDOCAINE-EPINEPHRINE 1 %-1:100000 IJ SOLN
INTRAMUSCULAR | Status: DC | PRN
  Administered 2015-11-06: 14 mL

## 2015-11-06 MED ORDER — PHENYLEPHRINE HCL 10 MG/ML IJ SOLN
INTRAMUSCULAR | Status: DC | PRN
  Administered 2015-11-06 (×2): 100 ug via INTRAVENOUS

## 2015-11-06 MED ORDER — FENTANYL CITRATE (PF) 100 MCG/2ML IJ SOLN: 25.0000 ug | INTRAMUSCULAR | Status: DC | PRN

## 2015-11-06 MED ORDER — OXYCODONE-ACETAMINOPHEN 5-325 MG PO TABS: 1.0000 | ORAL_TABLET | ORAL | Status: AC | PRN

## 2015-11-06 MED ORDER — ONDANSETRON HCL 4 MG/2ML IJ SOLN
INTRAMUSCULAR | Status: DC | PRN
  Administered 2015-11-06: 4 mg via INTRAVENOUS

## 2015-11-06 MED ORDER — ONDANSETRON HCL 4 MG/2ML IJ SOLN: 4.0000 mg | Freq: Once | INTRAMUSCULAR | Status: DC | PRN

## 2015-11-06 MED ORDER — LIDOCAINE HCL (PF) 1 % IJ SOLN
INTRAMUSCULAR | Status: AC
  Filled 2015-11-06: qty 2

## 2015-11-06 MED ORDER — SODIUM CHLORIDE 0.9 % IV SOLN
INTRAVENOUS | Status: DC
  Administered 2015-11-06: 11:00:00 via INTRAVENOUS

## 2015-11-06 MED ORDER — PROPOFOL 10 MG/ML IV BOLUS
INTRAVENOUS | Status: DC | PRN
  Administered 2015-11-06: 150 mg via INTRAVENOUS

## 2015-11-06 MED ORDER — EPHEDRINE SULFATE 50 MG/ML IJ SOLN
INTRAMUSCULAR | Status: DC | PRN
  Administered 2015-11-06: 10 mg via INTRAVENOUS
  Administered 2015-11-06: 5 mg via INTRAVENOUS

## 2015-11-06 MED ORDER — TOPICALS DOCUMENTATION OPTIME
TOPICAL | Status: DC | PRN
  Administered 2015-11-06: 1 via TOPICAL

## 2015-11-06 MED ORDER — KETOROLAC TROMETHAMINE 30 MG/ML IJ SOLN
INTRAMUSCULAR | Status: AC
  Filled 2015-11-06: qty 1

## 2015-11-06 MED ORDER — LIDOCAINE HCL (CARDIAC) 20 MG/ML IV SOLN
INTRAVENOUS | Status: DC | PRN
  Administered 2015-11-06: 80 mg via INTRAVENOUS

## 2015-11-06 SURGICAL SUPPLY — 41 items
BAG URO DRAIN 2000ML W/SPOUT (MISCELLANEOUS) ×3 IMPLANT
BLADE SURG 15 STRL LF DISP TIS (BLADE) ×1 IMPLANT
BLADE SURG 15 STRL SS (BLADE) ×2
BLADE SURG SZ10 CARB STEEL (BLADE) ×3 IMPLANT
CANISTER SUCT 1200ML W/VALVE (MISCELLANEOUS) ×3 IMPLANT
CATH FOLEY 2WAY  5CC 16FR (CATHETERS) ×2
CATH URTH 16FR FL 2W BLN LF (CATHETERS) ×1 IMPLANT
DRAPE PERI LITHO V/GYN (MISCELLANEOUS) ×3 IMPLANT
DRAPE SHEET LG 3/4 BI-LAMINATE (DRAPES) ×3 IMPLANT
DRAPE UNDER BUTTOCK W/FLU (DRAPES) ×3 IMPLANT
ELECT REM PT RETURN 9FT ADLT (ELECTROSURGICAL) ×3
ELECTRODE REM PT RTRN 9FT ADLT (ELECTROSURGICAL) ×1 IMPLANT
GAUZE PACK 2X3YD (MISCELLANEOUS) IMPLANT
GLOVE BIO SURGEON STRL SZ8 (GLOVE) ×6 IMPLANT
GOWN STRL REUS W/ TWL LRG LVL3 (GOWN DISPOSABLE) ×3 IMPLANT
GOWN STRL REUS W/ TWL XL LVL3 (GOWN DISPOSABLE) ×1 IMPLANT
GOWN STRL REUS W/TWL LRG LVL3 (GOWN DISPOSABLE) ×6
GOWN STRL REUS W/TWL XL LVL3 (GOWN DISPOSABLE) ×2
KIT RM TURNOVER CYSTO AR (KITS) ×3 IMPLANT
KIT RM TURNOVER STRD PROC AR (KITS) ×3 IMPLANT
LABEL OR SOLS (LABEL) ×3 IMPLANT
NDL HPO THNWL 1X22GA REG BVL (NEEDLE) IMPLANT
NEEDLE SAFETY 22GX1 (NEEDLE)
NEEDLE SPNL 22GX3.5 QUINCKE BK (NEEDLE) ×3 IMPLANT
NS IRRIG 500ML POUR BTL (IV SOLUTION) ×3 IMPLANT
PACK BASIN MINOR ARMC (MISCELLANEOUS) ×3 IMPLANT
PAD OB MATERNITY 4.3X12.25 (PERSONAL CARE ITEMS) ×3 IMPLANT
PAD PREP 24X41 OB/GYN DISP (PERSONAL CARE ITEMS) ×3 IMPLANT
SOL PREP PVP 2OZ (MISCELLANEOUS) ×3
SOLUTION PREP PVP 2OZ (MISCELLANEOUS) ×1 IMPLANT
SPONGE XRAY 4X4 16PLY STRL (MISCELLANEOUS) IMPLANT
SUT ETHIBOND NAB CT1 #1 30IN (SUTURE) ×3 IMPLANT
SUT VIC AB 0 CT1 27 (SUTURE) ×2
SUT VIC AB 0 CT1 27XCR 8 STRN (SUTURE) ×1 IMPLANT
SUT VIC AB 2-0 CT1 (SUTURE) ×3 IMPLANT
SUT VIC AB 2-0 CT1 27 (SUTURE)
SUT VIC AB 2-0 CT1 TAPERPNT 27 (SUTURE) IMPLANT
SUT VIC AB 3-0 SH 27 (SUTURE)
SUT VIC AB 3-0 SH 27X BRD (SUTURE) IMPLANT
SYR CONTROL 10ML (SYRINGE) IMPLANT
SYRINGE 10CC LL (SYRINGE) ×3 IMPLANT

## 2015-11-06 NOTE — Anesthesia Procedure Notes (Signed)
Procedure Name: Intubation Date/Time: 11/06/2015 12:58 PM Performed by: Michaele OfferSAVAGE, Kaitelyn Jamison Pre-anesthesia Checklist: Patient identified, Emergency Drugs available, Suction available, Patient being monitored and Timeout performed Patient Re-evaluated:Patient Re-evaluated prior to inductionOxygen Delivery Method: Circle system utilized Preoxygenation: Pre-oxygenation with 100% oxygen Intubation Type: IV induction Ventilation: Mask ventilation without difficulty Laryngoscope Size: Mac and 3 Grade View: Grade I Tube type: Oral Tube size: 7.0 mm Number of attempts: 1 Airway Equipment and Method: Rigid stylet Placement Confirmation: ETT inserted through vocal cords under direct vision,  positive ETCO2 and breath sounds checked- equal and bilateral Secured at: 19 cm Tube secured with: Tape Dental Injury: Teeth and Oropharynx as per pre-operative assessment

## 2015-11-06 NOTE — Anesthesia Preprocedure Evaluation (Signed)
Anesthesia Evaluation  Patient identified by MRN, date of birth, ID band Patient awake    Reviewed: Allergy & Precautions, NPO status , Patient's Chart, lab work & pertinent test results  Airway Mallampati: II  TM Distance: >3 FB     Dental  (+) Chipped   Pulmonary asthma , former smoker,    Pulmonary exam normal breath sounds clear to auscultation       Cardiovascular hypertension, Pt. on medications Normal cardiovascular exam     Neuro/Psych  Headaches, PSYCHIATRIC DISORDERS Anxiety Depression    GI/Hepatic Neg liver ROS, IBS   Endo/Other  diabetes, Well Controlled, Type 2, Oral Hypoglycemic AgentsHypothyroidism   Renal/GU negative Renal ROS     Musculoskeletal  (+) Arthritis , Osteoarthritis,  Chronic pain   Abdominal Normal abdominal exam  (+)   Peds negative pediatric ROS (+)  Hematology   Anesthesia Other Findings DM PTSD Asthma HTN  Reproductive/Obstetrics                             Anesthesia Physical Anesthesia Plan  ASA: III  Anesthesia Plan: General   Post-op Pain Management:    Induction: Intravenous  Airway Management Planned: Oral ETT  Additional Equipment:   Intra-op Plan:   Post-operative Plan: Extubation in OR  Informed Consent: I have reviewed the patients History and Physical, chart, labs and discussed the procedure including the risks, benefits and alternatives for the proposed anesthesia with the patient or authorized representative who has indicated his/her understanding and acceptance.   Dental advisory given  Plan Discussed with: CRNA and Surgeon  Anesthesia Plan Comments:         Anesthesia Quick Evaluation

## 2015-11-06 NOTE — Transfer of Care (Signed)
Immediate Anesthesia Transfer of Care Note  Patient: Leonides SakeRosemary T Leonhardt  Procedure(s) Performed: Procedure(s): ANTERIOR (CYSTOCELE) AND POSTERIOR REPAIR PERINEORRHAPHY (N/A)  Patient Location: PACU  Anesthesia Type:General  Level of Consciousness: awake, alert , oriented and patient cooperative  Airway & Oxygen Therapy: Patient Spontanous Breathing and Patient connected to face mask oxygen  Post-op Assessment: Report given to RN, Post -op Vital signs reviewed and stable and Patient moving all extremities X 4  Post vital signs: Reviewed and stable  Last Vitals:  Filed Vitals:   11/06/15 1422 11/06/15 1423  BP: 134/66 128/65  Pulse: 77 73  Temp: 36.7 C   Resp: 13 14    Last Pain: There were no vitals filed for this visit.       Complications: No apparent anesthesia complications

## 2015-11-06 NOTE — Anesthesia Postprocedure Evaluation (Signed)
Anesthesia Post Note  Patient: Margaret Case  Procedure(s) Performed: Procedure(s) (LRB): ANTERIOR (CYSTOCELE) AND POSTERIOR REPAIR PERINEORRHAPHY (N/A)  Patient location during evaluation: PACU Anesthesia Type: General Level of consciousness: awake and alert and oriented Pain management: pain level controlled Vital Signs Assessment: post-procedure vital signs reviewed and stable Respiratory status: spontaneous breathing Cardiovascular status: blood pressure returned to baseline Anesthetic complications: no    Last Vitals:  Filed Vitals:   11/06/15 1525 11/06/15 1603  BP: 104/73 106/62  Pulse: 60 62  Temp: 36.8 C   Resp: 14 14    Last Pain:  Filed Vitals:   11/06/15 1604  PainSc: 0-No pain                 Bonni Neuser

## 2015-11-06 NOTE — Op Note (Signed)
Preoperative diagnosis: Cystocele, Rectocele  Postoperative diagnosis: Same  Procedure: Anterior and Posterior Colporrhaphy, Perineorrhaphy   Surgeon: Annamarie MajorPaul Leyna Vanderkolk, M.D.  Anesthesia: general  Findings: Cystocele and Rectocele.  Estimated blood loss: 50 cc  Specimen: none   Disposition: Returned to recovery unit in stable condition  Procedure: Patient was taken to the OR where she was placed in dorsal lithotomy in [place] stirrups. She was prepped and draped in the usual sterile fashion. A timeout was performed. Foley is placed into bladder. A speculum was placed inside the vagina. Cervix is identified from [facility], and cystocele and rectocele are identified.  Anterior colporrhaphy is performed.  Allis clamps are placed along the anterior vaginal wall, lidocaine is used to infiltrate the plane, and incision is made midline vertical.  Endopelvic fascia is dissected free of vaginal mucosa.  Fascia is plicated w interrupted vicryl sutures.  Excess mucosa is excised.  Vaginal incision is closed with a running locking Vicryl suture. Excellent hemostasis was noted.  Posterior colporrhaphy is performed.  Allis clamps are placed along the posterior vaginal wall, lidocaine is used to infiltrate the plane, and incision is made midline vertical.  Endopelvic fascia is dissected free of vaginal mucosa.  Fascia is plicated w interrupted Ethibond suture x2 at the proximal end and then with vicryl sutures.  Excess mucosa is excised.  Vaginal incision is closed with a running locking Vicryl suture.  Perineorrhaphy is alos done at this time with excision of outer skin tissues overlying the perineum and plication of perineal body tissues and muscle to form a more anatomically correct perineum.  Hemostasis is noted.  Vaginal cavity is irrigated.     Packing gauze w AVC cream is placed. A Foley catheter is left in  place inside her bladder. Clear, yellow urine was noted. All instrument needle and lap counts were  correct x 2. Patient was awakened taken to recovery room in stable condition.  Letitia Libraobert Paul Georgia Delsignore, MD 11/06/2015, 2:21 PM

## 2015-11-06 NOTE — Discharge Instructions (Signed)
Anterior and Posterior Colporrhaphy °Anterior or posterior colporrhaphy is surgery to fix a prolapse of organs in the genital tract. Prolapse means the falling down, bulging, dropping, or drooping of an organ. Organs that commonly prolapse include the rectum, bladder, vagina, and uterus. Prolapse can affect a single organ or several organs at the same time. This often worsens when women stop having their monthly periods (menopause) because estrogen loss weakens the muscles and tissues in the genital tract. In addition, prolapse happens when the organs are damaged or weakened. This commonly happens after childbirth and as a result of aging. Surgery is often done for severe prolapses.  °The type of colporrhaphy done depends on the type of genital prolapse. Types of genital prolapse include the following:  °· Cystocele. This is a prolapse of the upper (anterior) wall of the vagina. The anterior wall bulges into the vagina and brings the bladder with it.   °· Rectocele. This is a prolapse of the lower (posterior) wall of the vagina. The posterior vaginal wall bulges into the vagina and brings the rectum with it.   °· Enterocele. This is a prolapse of part of the pelvic organs called the pouch of Douglas. It also involves a portion of the small bowel. It appears as a bulge under the neck of the uterus at the top of the back wall of the vagina.   °· Procidentia. This is a complete prolapse of the uterus and the cervix. The prolapse can be seen and felt coming out of the vagina. °LET YOUR HEALTH CARE PROVIDER KNOW ABOUT:  °· Any allergies you have.   °· All medicines you are taking, including vitamins, herbs, eye drops, creams, and over-the-counter medicines.   °· Previous problems you or members of your family have had with the use of anesthetics.   °· Any blood disorders you have.   °· Previous surgeries you have had.   °· Medical conditions you have.   °· Smoking history or history of alcohol use.   °· Possibility of  pregnancy, if this applies.   °RISKS AND COMPLICATIONS °Generally, anterior or posterior colporrhaphy is a safe procedure. However, as with any procedure, complications can occur. Possible complications include:  °· Infection.   °· Damage to other organs during surgery.   °· Bleeding after surgery.   °· Problems urinating.   °· Problems from the anesthetic.   °BEFORE THE PROCEDURE °· Ask your health care provider about changing or stopping your regular medicines.   °· Do not eat or drink anything for at least 8 hours before the surgery.   °· If you smoke, do not smoke for at least 2 weeks before the surgery.   °· Make plans to have someone drive you home after your hospital stay. Also, arrange for someone to help you with activities during recovery. °PROCEDURE  °You may be given medicine to help you relax before the surgery (sedative). During the surgery you will be given medicine to make you sleep through the procedure (general anesthetic) or medicine to numb you from the waist down (spinal anesthetic). This medicine will be given through an intravenous (IV) access tube that is put into one of your veins.  °The procedure will vary depending on the type of repair:  °· Anterior repair. A cut (incision) is made in the midline section of the front part of the vaginal wall. A triangular-shaped piece of vaginal tissue is removed, and the stronger, healthier tissue is sewn together in order to support and suspend the bladder.   °· Posterior repair. An incision is made midline on the back wall of the   vagina. A triangular portion of vaginal skin is removed to expose the muscle. Excess tissue is removed, and stronger, healthier muscle and ligament tissue is sewn together to support the rectum.   Anterior and posterior repair. Both procedures are done during the same surgery. AFTER THE PROCEDURE You will be taken to a recovery area. Your blood pressure, pulse, breathing, and temperature (vital signs) will be monitored.  This is done until you are stable. Then you will be transferred to a hospital room.  After surgery, you will have a small rubber tube in place to drain your bladder (urinary catheter). This will be in place for 2 to 7 days or until your bladder is working properly on its own. The IV access tube will be removed in 1 to 3 days. You may have a gauze packing in your vagina to prevent bleeding. This will be removed 2 or 3 days after the surgery. You will likely need to stay in the hospital for 3 to 5 days.    This information is not intended to replace advice given to you by your health care provider. Make sure you discuss any questions you have with your health care provider.   Document Released: 07/19/2003 Document Revised: 12/29/2012 Document Reviewed: 09/17/2012 Elsevier Interactive Patient Education 2016 [employer] AMBULATORY SURGERY  DISCHARGE INSTRUCTIONS   1) The drugs that you were given will stay in your system until tomorrow so for the next 24 hours you should not:  A) Drive an automobile B) Make any legal decisions C) Drink any alcoholic beverage   2) You may resume regular meals tomorrow.  Today it is better to start with liquids and gradually work up to solid foods.  You may eat anything you prefer, but it is better to start with liquids, then soup and crackers, and gradually work up to solid foods.   3) Please notify your doctor immediately if you have any unusual bleeding, trouble breathing, redness and pain at the surgery site, drainage, fever, or pain not relieved by medication.    4) Additional Instructions:        Please contact your physician with any problems or Same Day Surgery at [phone], Monday through Friday 6 am to 4 pm, or [place] [facility] Main number at [phone].

## 2015-11-06 NOTE — H&P (Signed)
History and Physical Interval Note:  11/06/2015 12:10 PM  Margaret Case  has presented today for surgery, with the diagnosis of CYSTOCELE, RECTOCELE, VAGINAL PAIN  The various methods of treatment have been discussed with the patient and family. After consideration of risks, benefits and other options for treatment, the patient has consented to  Procedure(s): ANTERIOR (CYSTOCELE) AND POSTERIOR REPAIR (RECTOCELE) (N/A) as a surgical intervention .  The patient's history has been reviewed, patient examined, no change in status, stable for surgery.  Pt has the following beta blocker history-  Not taking Beta Blocker.  I have reviewed the patient's chart and labs.  Questions were answered to the patient's satisfaction.    Letitia Libraobert Paul Harris

## 2015-11-06 NOTE — OR Nursing (Signed)
Foley catheter removed without difficulty--drained 150 clear yellow urine-vaginal packing removed--patient voided 100 cc 50 min after catheter removed--1 small clot from vagina in commode

## 2015-11-07 ENCOUNTER — Encounter: Payer: Self-pay | Admitting: Obstetrics & Gynecology

## 2016-01-30 ENCOUNTER — Ambulatory Visit (INDEPENDENT_AMBULATORY_CARE_PROVIDER_SITE_OTHER): Payer: Medicare Other | Admitting: Podiatry

## 2016-01-30 ENCOUNTER — Encounter: Payer: Self-pay | Admitting: Podiatry

## 2016-01-30 DIAGNOSIS — M79676 Pain in unspecified toe(s): Secondary | ICD-10-CM | POA: Diagnosis not present

## 2016-01-30 DIAGNOSIS — B351 Tinea unguium: Secondary | ICD-10-CM | POA: Diagnosis not present

## 2016-01-30 DIAGNOSIS — E1142 Type 2 diabetes mellitus with diabetic polyneuropathy: Secondary | ICD-10-CM

## 2016-01-30 NOTE — Progress Notes (Signed)
She presents today chief complaint of painful elongated toenails 1 through 5 bilateral.  Objective: Vital signs stable alert and oriented 3 pulses are palpable. Toenails are thick yellow dystrophic with mycotic and painful palpation.  Assessment: Pain in limb secondary onychomycosis.  Plan: Debridement of toenails 1 through 5 bilateral.

## 2016-04-09 ENCOUNTER — Ambulatory Visit: Payer: Medicare Other | Admitting: Podiatry

## 2016-04-14 ENCOUNTER — Ambulatory Visit: Payer: Medicare Other | Admitting: Podiatry

## 2016-08-11 ENCOUNTER — Encounter: Payer: Self-pay | Admitting: Podiatry

## 2016-08-11 ENCOUNTER — Ambulatory Visit (INDEPENDENT_AMBULATORY_CARE_PROVIDER_SITE_OTHER): Payer: Medicare Other | Admitting: Podiatry

## 2016-08-11 ENCOUNTER — Ambulatory Visit (INDEPENDENT_AMBULATORY_CARE_PROVIDER_SITE_OTHER): Payer: Medicare Other

## 2016-08-11 DIAGNOSIS — R52 Pain, unspecified: Secondary | ICD-10-CM | POA: Diagnosis not present

## 2016-08-11 DIAGNOSIS — M722 Plantar fascial fibromatosis: Secondary | ICD-10-CM

## 2016-08-11 MED ORDER — MELOXICAM 15 MG PO TABS: 15.0000 mg | ORAL_TABLET | Freq: Every day | ORAL | 0 refills | Status: AC

## 2016-08-11 NOTE — Addendum Note (Signed)
Addended byMaury Dus, Lilac Hoff L on: 08/11/2016 05:24 PM   Modules accepted: Orders

## 2016-08-11 NOTE — Progress Notes (Signed)
This patient presents the office for continued treatment of her ingrowing toenails on both feet. He says the nails are not painful, but that her left heel has become very painful as she is been experiencing pain for the last month in her left heel and is increasing in severity has not provided any self treatment for this condition. She denies any history of trauma or injury to the foot. This patient is diabetic. She desires an evaluation and treatment of her painful left heel  .GENERAL APPEARANCE: Alert, conversant. Appropriately groomed. No acute distress.  VASCULAR: Pedal pulses are  palpable at  [facility] and PT bilateral.  Capillary refill time is immediate to all digits,  Normal temperature gradient.  NEUROLOGIC: sensation is normal to 5.07 monofilament at 5/5 sites bilateral.  Light touch is intact bilateral, Muscle strength normal.  MUSCULOSKELETAL: acceptable muscle strength, tone and stability bilateral.  Intrinsic muscluature intact bilateral.  Rectus appearance of foot and digits noted bilateral.  Palpable pain at the insertion plantar fascia left heel.  Mild swelling noted left heel.  DERMATOLOGIC: skin color, texture, and turgor are within normal limits.  No preulcerative lesions or ulcers  are seen, no interdigital maceration noted.  No open lesions present.  Digital nails are asymptomatic but ingrown. No drainage noted.  Plantar fascitis left heel.  ROV  x-rays reveal no evidence of calcification at the insertion of the plantar fascia left foot. Stretching and icing were recommended for this patient. She was prescribed power step insoles to be worn in her shoes. She was prescribed Mobic No. 30.  Injection therapy in the left heel was performed 10 clinic in 2 weeks for further evaluation and treatment

## 2016-08-25 ENCOUNTER — Ambulatory Visit (INDEPENDENT_AMBULATORY_CARE_PROVIDER_SITE_OTHER): Payer: Medicare Other | Admitting: Podiatry

## 2016-08-25 ENCOUNTER — Encounter: Payer: Self-pay | Admitting: Podiatry

## 2016-08-25 DIAGNOSIS — M722 Plantar fascial fibromatosis: Secondary | ICD-10-CM

## 2016-08-25 DIAGNOSIS — M79676 Pain in unspecified toe(s): Secondary | ICD-10-CM | POA: Diagnosis not present

## 2016-08-25 DIAGNOSIS — E1142 Type 2 diabetes mellitus with diabetic polyneuropathy: Secondary | ICD-10-CM | POA: Diagnosis not present

## 2016-08-25 DIAGNOSIS — B351 Tinea unguium: Secondary | ICD-10-CM | POA: Diagnosis not present

## 2016-08-25 NOTE — Progress Notes (Signed)
Complaint:  Visit Type: Patient returns to my office for continued preventative foot care services. Complaint: Patient states" my nails have grown long and thick and become painful to walk and wear shoes" Patient has been diagnosed with DM with no foot complications. The patient presents for preventative foot care services. No changes to ROS  Podiatric Exam: Vascular: dorsalis pedis and posterior tibial pulses are palpable bilateral. Capillary return is immediate. Temperature gradient is WNL. Skin turgor WNL  Sensorium: Normal Semmes Weinstein monofilament test. Normal tactile sensation bilaterally. Nail Exam: Pt has thick disfigured discolored nails with subungual debris noted bilateral entire nail hallux through fifth toenails Ulcer Exam: There is no evidence of ulcer or pre-ulcerative changes or infection. Orthopedic Exam: Muscle tone and strength are WNL. No limitations in general ROM. No crepitus or effusions noted. Foot type and digits show no abnormalities. Bony prominences are unremarkable. Skin: No Porokeratosis. No infection or ulcers  Diagnosis:  Onychomycosis, , Pain in right toe, pain in left toes  Treatment & Plan Procedures and Treatment: Consent by patient was obtained for treatment procedures. The patient understood the discussion of treatment and procedures well. All questions were answered thoroughly reviewed. Debridement of mycotic and hypertrophic toenails, 1 through 5 bilateral and clearing of subungual debris. No ulceration, no infection noted.  Return Visit-Office Procedure: Patient instructed to return to the office for a follow up visit 3 months for continued evaluation and treatment.    Pariss Hommes DPM 

## 2016-10-08 NOTE — Addendum Note (Signed)
Addended by: Berna BueWHITAKER, Marquee Fuchs L on: 10/08/2016 12:19 PM   Modules accepted: Orders

## 2016-11-24 ENCOUNTER — Ambulatory Visit (INDEPENDENT_AMBULATORY_CARE_PROVIDER_SITE_OTHER): Payer: Medicare Other | Admitting: Podiatry

## 2016-11-24 ENCOUNTER — Encounter: Payer: Self-pay | Admitting: Podiatry

## 2016-11-24 DIAGNOSIS — B351 Tinea unguium: Secondary | ICD-10-CM

## 2016-11-24 DIAGNOSIS — M79676 Pain in unspecified toe(s): Secondary | ICD-10-CM | POA: Diagnosis not present

## 2016-11-24 DIAGNOSIS — E1142 Type 2 diabetes mellitus with diabetic polyneuropathy: Secondary | ICD-10-CM

## 2016-11-24 NOTE — Progress Notes (Signed)
Complaint:  Visit Type: Patient returns to my office for continued preventative foot care services. Complaint: Patient states" my nails have grown long and thick and become painful to walk and wear shoes" Patient has been diagnosed with DM with no foot complications. The patient presents for preventative foot care services. No changes to ROS  Podiatric Exam: Vascular: dorsalis pedis and posterior tibial pulses are palpable bilateral. Capillary return is immediate. Temperature gradient is WNL. Skin turgor WNL  Sensorium: Normal Semmes Weinstein monofilament test. Normal tactile sensation bilaterally. Nail Exam: Pt has thick disfigured discolored nails with subungual debris noted bilateral entire nail hallux through fifth toenails Ulcer Exam: There is no evidence of ulcer or pre-ulcerative changes or infection. Orthopedic Exam: Muscle tone and strength are WNL. No limitations in general ROM. No crepitus or effusions noted. Foot type and digits show no abnormalities. Bony prominences are unremarkable. Skin: No Porokeratosis. No infection or ulcers  Diagnosis:  Onychomycosis, , Pain in right toe, pain in left toes  Treatment & Plan Procedures and Treatment: Consent by patient was obtained for treatment procedures. The patient understood the discussion of treatment and procedures well. All questions were answered thoroughly reviewed. Debridement of mycotic and hypertrophic toenails, 1 through 5 bilateral and clearing of subungual debris. No ulceration, no infection noted.  Return Visit-Office Procedure: Patient instructed to return to the office for a follow up visit 3 months for continued evaluation and treatment.    Beverly Ferner DPM 

## 2017-02-23 ENCOUNTER — Ambulatory Visit: Payer: Medicare Other | Admitting: Podiatry

## 2017-12-21 ENCOUNTER — Encounter: Payer: Self-pay | Admitting: Podiatry

## 2017-12-21 ENCOUNTER — Ambulatory Visit (INDEPENDENT_AMBULATORY_CARE_PROVIDER_SITE_OTHER): Payer: Medicare Other | Admitting: Podiatry

## 2017-12-21 DIAGNOSIS — M79676 Pain in unspecified toe(s): Secondary | ICD-10-CM

## 2017-12-21 DIAGNOSIS — B351 Tinea unguium: Secondary | ICD-10-CM

## 2017-12-21 DIAGNOSIS — E1142 Type 2 diabetes mellitus with diabetic polyneuropathy: Secondary | ICD-10-CM

## 2017-12-21 NOTE — Progress Notes (Signed)
Complaint:  Visit Type: Patient returns to my office for continued preventative foot care services. Complaint: Patient states" my nails have grown long and thick and become painful to walk and wear shoes" Patient has been diagnosed with DM with no foot complications. The patient presents for preventative foot care services. No changes to ROS  Podiatric Exam: Vascular: dorsalis pedis and posterior tibial pulses are palpable bilateral. Capillary return is immediate. Temperature gradient is WNL. Skin turgor WNL  Sensorium: Normal Semmes Weinstein monofilament test. Normal tactile sensation bilaterally. Nail Exam: Pt has thick disfigured discolored nails with subungual debris noted bilateral entire nail hallux through fifth toenails Ulcer Exam: There is no evidence of ulcer or pre-ulcerative changes or infection. Orthopedic Exam: Muscle tone and strength are WNL. No limitations in general ROM. No crepitus or effusions noted. Foot type and digits show no abnormalities. Bony prominences are unremarkable. Skin: No Porokeratosis. No infection or ulcers  Diagnosis:  Onychomycosis, , Pain in right toe, pain in left toes  Treatment & Plan Procedures and Treatment: Consent by patient was obtained for treatment procedures. The patient understood the discussion of treatment and procedures well. All questions were answered thoroughly reviewed. Debridement of mycotic and hypertrophic toenails, 1 through 5 bilateral and clearing of subungual debris. No ulceration, no infection noted. Patient requests nail border surgery  hallux  B/l.  Patient was told to get her HgA1C control. Return Visit-Office Procedure: Patient instructed to return to the office for a follow up visit 3 months for continued evaluation and treatment.    Helane GuntherGregory Dace Denn DPM

## 2017-12-28 ENCOUNTER — Telehealth: Payer: Self-pay | Admitting: Podiatry

## 2017-12-28 NOTE — Telephone Encounter (Signed)
This is Data processing managerTiwanya calling [facility]. I'm calling to get the office visit notes from date of service 12 August faxed to me at [phone]. If you need to speak with me, you can reach me at [phone]. Thank you.

## 2018-03-25 ENCOUNTER — Ambulatory Visit: Payer: Medicare Other | Admitting: Podiatry

## 2024-05-23 ENCOUNTER — Ambulatory Visit: Admitting: Pain Medicine

## 2024-05-31 DIAGNOSIS — Z23 Encounter for immunization: Secondary | ICD-10-CM | POA: Insufficient documentation

## 2024-05-31 DIAGNOSIS — E559 Vitamin D deficiency, unspecified: Secondary | ICD-10-CM | POA: Insufficient documentation

## 2024-05-31 DIAGNOSIS — A609 Anogenital herpesviral infection, unspecified: Secondary | ICD-10-CM | POA: Insufficient documentation

## 2024-05-31 DIAGNOSIS — F331 Major depressive disorder, recurrent, moderate: Secondary | ICD-10-CM | POA: Insufficient documentation

## 2024-05-31 DIAGNOSIS — R002 Palpitations: Secondary | ICD-10-CM | POA: Insufficient documentation

## 2024-05-31 DIAGNOSIS — F32A Depression, unspecified: Secondary | ICD-10-CM | POA: Insufficient documentation

## 2024-05-31 DIAGNOSIS — M5412 Radiculopathy, cervical region: Secondary | ICD-10-CM | POA: Insufficient documentation

## 2024-05-31 DIAGNOSIS — Z658 Other specified problems related to psychosocial circumstances: Secondary | ICD-10-CM | POA: Insufficient documentation

## 2024-05-31 DIAGNOSIS — H8110 Benign paroxysmal vertigo, unspecified ear: Secondary | ICD-10-CM | POA: Insufficient documentation

## 2024-05-31 DIAGNOSIS — M545 Low back pain, unspecified: Secondary | ICD-10-CM | POA: Insufficient documentation

## 2024-05-31 DIAGNOSIS — T7840XA Allergy, unspecified, initial encounter: Secondary | ICD-10-CM | POA: Insufficient documentation

## 2024-05-31 DIAGNOSIS — M5417 Radiculopathy, lumbosacral region: Secondary | ICD-10-CM | POA: Insufficient documentation

## 2024-05-31 DIAGNOSIS — E1142 Type 2 diabetes mellitus with diabetic polyneuropathy: Secondary | ICD-10-CM | POA: Insufficient documentation

## 2024-05-31 DIAGNOSIS — I6782 Cerebral ischemia: Secondary | ICD-10-CM | POA: Insufficient documentation

## 2024-05-31 DIAGNOSIS — G43909 Migraine, unspecified, not intractable, without status migrainosus: Secondary | ICD-10-CM | POA: Insufficient documentation

## 2024-05-31 DIAGNOSIS — S6990XA Unspecified injury of unspecified wrist, hand and finger(s), initial encounter: Secondary | ICD-10-CM | POA: Insufficient documentation

## 2024-05-31 DIAGNOSIS — F17200 Nicotine dependence, unspecified, uncomplicated: Secondary | ICD-10-CM | POA: Insufficient documentation

## 2024-05-31 DIAGNOSIS — Z049 Encounter for examination and observation for unspecified reason: Secondary | ICD-10-CM | POA: Insufficient documentation

## 2024-05-31 DIAGNOSIS — B351 Tinea unguium: Secondary | ICD-10-CM | POA: Insufficient documentation

## 2024-05-31 DIAGNOSIS — Z719 Counseling, unspecified: Secondary | ICD-10-CM | POA: Insufficient documentation

## 2024-05-31 DIAGNOSIS — R262 Difficulty in walking, not elsewhere classified: Secondary | ICD-10-CM | POA: Insufficient documentation

## 2024-05-31 DIAGNOSIS — Z79899 Other long term (current) drug therapy: Secondary | ICD-10-CM | POA: Insufficient documentation

## 2024-05-31 DIAGNOSIS — L509 Urticaria, unspecified: Secondary | ICD-10-CM | POA: Insufficient documentation

## 2024-05-31 DIAGNOSIS — L309 Dermatitis, unspecified: Secondary | ICD-10-CM | POA: Insufficient documentation

## 2024-05-31 DIAGNOSIS — R1310 Dysphagia, unspecified: Secondary | ICD-10-CM | POA: Insufficient documentation

## 2024-05-31 DIAGNOSIS — M542 Cervicalgia: Secondary | ICD-10-CM | POA: Insufficient documentation

## 2024-05-31 DIAGNOSIS — G894 Chronic pain syndrome: Secondary | ICD-10-CM | POA: Insufficient documentation

## 2024-05-31 DIAGNOSIS — R945 Abnormal results of liver function studies: Secondary | ICD-10-CM | POA: Insufficient documentation

## 2024-05-31 DIAGNOSIS — R45851 Suicidal ideations: Secondary | ICD-10-CM | POA: Insufficient documentation

## 2024-05-31 DIAGNOSIS — F4310 Post-traumatic stress disorder, unspecified: Secondary | ICD-10-CM | POA: Insufficient documentation

## 2024-05-31 DIAGNOSIS — M79602 Pain in left arm: Secondary | ICD-10-CM | POA: Insufficient documentation

## 2024-05-31 DIAGNOSIS — Z789 Other specified health status: Secondary | ICD-10-CM | POA: Insufficient documentation

## 2024-05-31 DIAGNOSIS — X58XXXA Exposure to other specified factors, initial encounter: Secondary | ICD-10-CM | POA: Insufficient documentation

## 2024-05-31 DIAGNOSIS — F902 Attention-deficit hyperactivity disorder, combined type: Secondary | ICD-10-CM | POA: Insufficient documentation

## 2024-05-31 DIAGNOSIS — Z7729 Contact with and (suspected ) exposure to other hazardous substances: Secondary | ICD-10-CM | POA: Insufficient documentation

## 2024-05-31 DIAGNOSIS — E669 Obesity, unspecified: Secondary | ICD-10-CM | POA: Insufficient documentation

## 2024-05-31 DIAGNOSIS — G4700 Insomnia, unspecified: Secondary | ICD-10-CM | POA: Insufficient documentation

## 2024-05-31 DIAGNOSIS — R300 Dysuria: Secondary | ICD-10-CM | POA: Insufficient documentation

## 2024-05-31 DIAGNOSIS — Z7181 Spiritual or religious counseling: Secondary | ICD-10-CM | POA: Insufficient documentation

## 2024-05-31 DIAGNOSIS — J45909 Unspecified asthma, uncomplicated: Secondary | ICD-10-CM | POA: Insufficient documentation

## 2024-05-31 DIAGNOSIS — H04123 Dry eye syndrome of bilateral lacrimal glands: Secondary | ICD-10-CM | POA: Insufficient documentation

## 2024-05-31 DIAGNOSIS — I10 Essential (primary) hypertension: Secondary | ICD-10-CM | POA: Insufficient documentation

## 2024-05-31 DIAGNOSIS — H9190 Unspecified hearing loss, unspecified ear: Secondary | ICD-10-CM | POA: Insufficient documentation

## 2024-05-31 DIAGNOSIS — G8929 Other chronic pain: Secondary | ICD-10-CM | POA: Insufficient documentation

## 2024-05-31 DIAGNOSIS — M722 Plantar fascial fibromatosis: Secondary | ICD-10-CM | POA: Insufficient documentation

## 2024-05-31 DIAGNOSIS — M5090 Cervical disc disorder, unspecified, unspecified cervical region: Secondary | ICD-10-CM | POA: Insufficient documentation

## 2024-05-31 DIAGNOSIS — K140 Glossitis: Secondary | ICD-10-CM | POA: Insufficient documentation

## 2024-05-31 DIAGNOSIS — R569 Unspecified convulsions: Secondary | ICD-10-CM | POA: Insufficient documentation

## 2024-05-31 DIAGNOSIS — E1140 Type 2 diabetes mellitus with diabetic neuropathy, unspecified: Secondary | ICD-10-CM | POA: Insufficient documentation

## 2024-05-31 DIAGNOSIS — E782 Mixed hyperlipidemia: Secondary | ICD-10-CM | POA: Insufficient documentation

## 2024-05-31 DIAGNOSIS — E7800 Pure hypercholesterolemia, unspecified: Secondary | ICD-10-CM | POA: Insufficient documentation

## 2024-05-31 DIAGNOSIS — M899 Disorder of bone, unspecified: Secondary | ICD-10-CM | POA: Insufficient documentation

## 2024-05-31 DIAGNOSIS — K760 Fatty (change of) liver, not elsewhere classified: Secondary | ICD-10-CM | POA: Insufficient documentation

## 2024-05-31 DIAGNOSIS — B9689 Other specified bacterial agents as the cause of diseases classified elsewhere: Secondary | ICD-10-CM | POA: Insufficient documentation

## 2024-05-31 DIAGNOSIS — K52831 Collagenous colitis: Secondary | ICD-10-CM | POA: Insufficient documentation

## 2024-05-31 DIAGNOSIS — F410 Panic disorder [episodic paroxysmal anxiety] without agoraphobia: Secondary | ICD-10-CM | POA: Insufficient documentation

## 2024-05-31 DIAGNOSIS — G43719 Chronic migraine without aura, intractable, without status migrainosus: Secondary | ICD-10-CM | POA: Insufficient documentation

## 2024-05-31 DIAGNOSIS — D219 Benign neoplasm of connective and other soft tissue, unspecified: Secondary | ICD-10-CM | POA: Insufficient documentation

## 2024-05-31 DIAGNOSIS — F4002 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: Secondary | ICD-10-CM | POA: Insufficient documentation

## 2024-05-31 DIAGNOSIS — M5013 Cervical disc disorder with radiculopathy, cervicothoracic region: Secondary | ICD-10-CM | POA: Insufficient documentation

## 2024-05-31 DIAGNOSIS — R079 Chest pain, unspecified: Secondary | ICD-10-CM | POA: Insufficient documentation

## 2024-05-31 DIAGNOSIS — M25519 Pain in unspecified shoulder: Secondary | ICD-10-CM | POA: Insufficient documentation

## 2024-05-31 DIAGNOSIS — F439 Reaction to severe stress, unspecified: Secondary | ICD-10-CM | POA: Insufficient documentation

## 2024-05-31 NOTE — Progress Notes (Unsigned)
 PROVIDER NOTE: Interpretation of information contained herein should be left to medically-trained personnel. Specific patient instructions are provided elsewhere under Patient Instructions section of medical record. This document was created in part using AI and STT-dictation technology, any transcriptional errors that may result from this process are unintentional.  Patient: Margaret Case Sar  Service: E/M Encounter  Provider: Eric DELENA Como, MD  DOB: 1960-05-18  Delivery: Face-to-face  Specialty: Interventional Pain Management  MRN: [id number]  Setting: Ambulatory outpatient facility  Specialty designation: 09  Type: New Patient  Location: Outpatient office facility  PCP: Donavon Corrie BIRCH, NP  DOS: 06/01/2024    Referring Prov.: Madie Line, MD   Primary Reason(s) for Visit: Encounter for initial evaluation of one or more chronic problems (new to examiner) potentially causing chronic pain, and posing a threat to normal musculoskeletal function. (Level of risk: High) CC: No chief complaint on file.  HPI  Margaret Case is a 64 y.o. year old, female patient, who comes for the first time to our practice referred by Madie Line, MD for our initial evaluation of her chronic pain. She has Diabetes mellitus, type 2 (HCC); Adaptation reaction; Allergic rhinitis; Airway hyperreactivity; Hypothyroid; Cystocele; Rectocele; Suicidal ideations; Counseling, unspecified; Pain in left arm; Arthralgia of shoulder; Pain in shoulder; Palpitations; Panic attack; Plantar fasciitis; Seizure (HCC); Suicidal thoughts; BP (high blood pressure); Temporary cerebral vascular dysfunction; AB (asthmatic bronchitis); Clinical depression; Chronic migraine without aura, intractable, without status migrainosus; Chronic pain syndrome; Chronic back pain; Chest pain, unspecified; Radiculopathy, cervical region; Cervical radiculopathy; Cervical disc disorder with radiculopathy, cervicothoracic region; Cervical disc disease;  Cannot sleep; Avitaminosis D; Attention deficit hyperactivity disorder, combined type; Anogenital herpes simplex virus (HSV) infection; Allergy; 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; Accident; Abnormal results of liver function studies; Other specified problems related to psychosocial circumstances; Spiritual or religious counseling; Encounter for immunization; Other specified bacterial agents as the cause of diseases classified elsewhere; Onychomycosis; Dermatitis, unspecified; Difficulty in walking, not elsewhere classified; Difficulty swallowing solids; Dry eye syndrome of bilateral lacrimal glands; Dysuria; Exposure to potentially hazardous substance; Feeling stressed out; Fibroid; Finger injury; Glossitis; Headache, migraine; Hearing loss; Hive; Hypercholesteremia; Mixed hyperlipidemia; Lumbosacral radiculopathy; Major depressive disorder, recurrent, moderate (HCC); Fatty (change of) liver, not elsewhere classified; Metabolic dysfunction-associated steatotic liver disease (MASLD); Neck pain; Neurosis, posttraumatic; Low back pain; Diabetic polyneuropathy (HCC); Obesity; Observation for suspected condition; Diabetic neuropathy (HCC); Compulsive tobacco user syndrome; Collagenous colitis; Benign positional vertigo; Pharmacologic therapy; Disorder of skeletal system; and Problems influencing health status on their problem list. Today she comes in for evaluation of her No chief complaint on file.  Pain Assessment: Location:     Radiating:   Onset:   Duration:   Quality:   Severity:  /10 (subjective, self-reported pain score)  Effect on ADL:   Timing:   Modifying factors:   BP:    HR:    Onset and Duration: {Hx; Onset and Duration:210120511} Cause of pain: {Hx; Cause:210120521} Severity: {Pain Severity:210120502} Timing: {Symptoms; Timing:210120501} Aggravating Factors: {Causes; Aggravating pain factors:210120507} Alleviating Factors: {Causes; Alleviating Factors:210120500} Associated Problems:  {Hx; Associated problems:210120515} Quality of Pain: {Hx; Symptom quality or Descriptor:210120531} Previous Examinations or Tests: {Hx; Previous examinations or test:210120529} Previous Treatments: {Hx; Previous Treatment:210120503}  Ms. Baby is being evaluated for possible interventional pain management therapies for the treatment of her chronic pain.  Discussed the use of AI scribe software for clinical note transcription with the patient, who gave verbal consent to proceed.  History of Present Illness  Margaret Case has been informed that this initial visit was an evaluation only.  On the follow up appointment I will go over the results, including ordered tests and available interventional therapies. At that time she will have the opportunity to decide whether to proceed with offered therapies or not. In the event that Ms. Skillen prefers avoiding interventional options, this will conclude our involvement in the case.  Medication management recommendations may be provided upon request.  Patient informed that diagnostic tests may be ordered to assist in identifying underlying causes, narrow the list of differential diagnoses and aid in determining candidacy for (or contraindications to) planned therapeutic interventions.  Historic Controlled Substance Pharmacotherapy Review PMP and historical list of controlled substances: Tramadol Hcl 50 Mg Tablet  ; Pregabalin 300 Mg Capsule  Most recently prescribed controlled substance(s): Opioid Analgesic: Tramadol Hcl 50 Mg Tablet  MME/day: 30 mg/day  Historical Monitoring: The patient  reports no history of drug use. List of prior UDS Testing: No results found for: MDMA, COCAINSCRNUR, PCPSCRNUR, PCPQUANT, CANNABQUANT, THCU, ETH, CBDTHCR, D8THCCBX, D9THCCBX Historical Background Evaluation: [place] PMP: PDMP reviewed during this encounter. Review of the past 72-months conducted.             PMP NARX Score Report:  Narcotic:  340 Sedative: 451 Stimulant: 000 [place] Department of public safety, offender search: Engineer, [employer] Information) Non-contributory Risk Assessment Profile: Aberrant behavior: None observed or detected today Risk factors for fatal opioid overdose: None identified today PMP NARX Overdose Risk Score: 210 Fatal overdose hazard ratio (HR): Calculation deferred Non-fatal overdose hazard ratio (HR): Calculation deferred Risk of opioid abuse or dependence: 0.7-3.0% with doses <= 36 MME/day and 6.1-26% with doses >= 120 MME/day. Substance use disorder (SUD) risk level: See below Personal History of Substance Abuse (SUD-Substance use disorder):  Alcohol:    Illegal Drugs:    Rx Drugs:    ORT Risk Level calculation:    ORT Scoring interpretation table:  Score <3 = Low Risk for SUD  Score between 4-7 = Moderate Risk for SUD  Score >8 = High Risk for Opioid Abuse   PHQ-2 Depression Scale:  Total score:    PHQ-2 Scoring interpretation table: (Score and probability of major depressive disorder)  Score 0 = No depression  Score 1 = 15.4% Probability  Score 2 = 21.1% Probability  Score 3 = 38.4% Probability  Score 4 = 45.5% Probability  Score 5 = 56.4% Probability  Score 6 = 78.6% Probability   PHQ-9 Depression Scale:  Total score:    PHQ-9 Scoring interpretation table:  Score 0-4 = No depression  Score 5-9 = Mild depression  Score 10-14 = Moderate depression  Score 15-19 = Moderately severe depression  Score 20-27 = Severe depression (2.4 times higher risk of SUD and 2.89 times higher risk of overuse)   Pharmacologic Plan: As per protocol, I have not taken over any controlled substance management, pending the results of ordered tests and/or consults.            Initial impression: Pending review of available data and ordered tests.  Meds  Current Medications[1]  Imaging Review  Cervical Imaging: Cervical DG 1 view: Results for orders placed during the hospital encounter of 12/02/10 DG  Cervical Spine 1 View  Narrative *RADIOLOGY REPORT*  Clinical Data: Cervical spondylosis and myelopathy.  Posterior cervical pain.  CERVICAL SPINE - 1 VIEW  Comparison: 07/16/2010  Findings: Anterior plate screw fixator noted at C5-C6-C7, without subluxation or complicating feature identified.  Interbody spacers remain  in place.  No lucency along the fixation screws.  No prevertebral soft tissue swelling noted.  IMPRESSION:  1.  Similar postoperative appearance noted, without complicating feature.  Original Report Authenticated By: RYAN CHARM SALVAGE, M.D.  Complexity Note: Imaging results reviewed.                         ROS  Cardiovascular: {Hx; Cardiovascular History:210120525} Pulmonary or Respiratory: {Hx; Pumonary and/or Respiratory History:210120523} Neurological: {Hx; Neurological:210120504} Psychological-Psychiatric: {Hx; Psychological-Psychiatric History:210120512} Gastrointestinal: {Hx; Gastrointestinal:210120527} Genitourinary: {Hx; Genitourinary:210120506} Hematological: {Hx; Hematological:210120510} Endocrine: {Hx; Endocrine history:210120509} Rheumatologic: {Hx; Rheumatological:210120530} Musculoskeletal: {Hx; Musculoskeletal:210120528} Work History: {Hx; Work history:210120514}  Allergies  Ms. Nygaard is allergic to bee venom, nitrofurantoin monohyd macro, sulfamethoxazole-trimethoprim, powder, and sulfa antibiotics.  Laboratory Chemistry Profile   Renal Lab Results  Component Value Date   BUN 19 11/06/2015   CREATININE 1.02 (H) 11/06/2015   GFRAA >60 11/06/2015   GFRNONAA >60 11/06/2015   PROTEINUR Negative 09/02/2011     Electrolytes Lab Results  Component Value Date   NA 142 11/06/2015   K 4.2 11/06/2015   CL 114 (H) 11/06/2015   CALCIUM 9.0 11/06/2015     Hepatic Lab Results  Component Value Date   AST 18 09/02/2011   ALT 24 09/02/2011   ALBUMIN 3.9 09/02/2011   ALKPHOS 120 09/02/2011     ID Lab Results  Component Value  Date   STAPHAUREUS  01/21/2010    NEGATIVE        The Xpert SA Assay (FDA approved for NASAL specimens only), is one component of a comprehensive surveillance program.  It is not intended to diagnose infection nor to guide or monitor treatment.   MRSAPCR NEGATIVE 01/21/2010     Bone No results found for: VD25OH, [id number], [id number], CI7874NY7, 25OHVITD1, 25OHVITD2, 25OHVITD3, TESTOFREE, TESTOSTERONE   Endocrine Lab Results  Component Value Date   GLUCOSE 141 (H) 11/06/2015   GLUCOSEU Negative 09/02/2011     Neuropathy No results found for: VITAMINB12, FOLATE, HGBA1C, HIV   CNS No results found for: COLORCSF, APPEARCSF, RBCCOUNTCSF, WBCCSF, POLYSCSF, LYMPHSCSF, EOSCSF, PROTEINCSF, GLUCCSF, JCVIRUS, CSFOLI, IGGCSF, LABACHR, ACETBL   Inflammation (CRP: Acute  ESR: Chronic) No results found for: CRP, ESRSEDRATE, LATICACIDVEN   Rheumatology No results found for: RF, ANA, LABURIC, URICUR, LYMEIGGIGMAB, LYMEABIGMQN, HLAB27   Coagulation Lab Results  Component Value Date   INR 0.93 11/05/2015   LABPROT 12.7 11/05/2015   APTT 29 11/05/2015   PLT 263 11/05/2015     Cardiovascular Lab Results  Component Value Date   CKTOTAL 44 09/03/2011   CKMB 0.6 09/03/2011   TROPONINI < 0.02 09/03/2011   HGB 12.7 11/05/2015   HCT 37.0 11/05/2015     Screening Lab Results  Component Value Date   STAPHAUREUS  01/21/2010    NEGATIVE        The Xpert SA Assay (FDA approved for NASAL specimens only), is one component of a comprehensive surveillance program.  It is not intended to diagnose infection nor to guide or monitor treatment.   MRSAPCR NEGATIVE 01/21/2010     Cancer No results found for: CEA, CA125, LABCA2   Allergens No results found for: ALMOND, APPLE, ASPARAGUS, AVOCADO, BANANA, BARLEY, BASIL, BAYLEAF, GREENBEAN, LIMABEAN, WHITEBEAN, BEEFIGE, REDBEET,  BLUEBERRY, BROCCOLI, CABBAGE, MELON, CARROT, CASEIN, CASHEWNUT, CAULIFLOWER, CELERY     Note: Lab results reviewed.  PFSH  Drug: Ms. Leverett  reports no history of drug use. Alcohol:  has no history on file for alcohol  use. Tobacco:  reports that she quit smoking about 9 years ago. She has never used smokeless tobacco. Medical:  has a past medical history of Anxiety, Asthma, Brown recluse spider bite (2014), Depression, Diabetes (HCC), Headache, Hypertension, Hypothyroidism, IBS (irritable bowel syndrome), and PTSD (post-traumatic stress disorder). Family: family history includes Allergic rhinitis in her father and mother; Diabetes in her father and mother; Glaucoma in her mother; Hypertension in her father; Kidney disease in her father.  Past Surgical History:  Procedure Laterality Date   ABDOMINAL HYSTERECTOMY     ANTERIOR AND POSTERIOR REPAIR N/A 11/06/2015   Procedure: ANTERIOR (CYSTOCELE) AND POSTERIOR REPAIR PERINEORRHAPHY;  Surgeon: Lamar SHAUNNA Lesches, MD;  Location: [facility] OR[facility];  Service: Gynecology;  Laterality: N/A;   BACK SURGERY     laminectomy Lumbar a sacral   SPINAL FUSION     TONSILLECTOMY     Active Ambulatory Problems    Diagnosis Date Noted   Diabetes mellitus, type 2 (HCC) 10/27/2014   Adaptation reaction 07/02/2015   Allergic rhinitis 07/02/2015   Airway hyperreactivity 07/02/2015   Hypothyroid 07/02/2015   Cystocele 11/06/2015   Rectocele 11/06/2015   Suicidal ideations 05/31/2024   Counseling, unspecified 05/31/2024   Pain in left arm 05/31/2024   Arthralgia of shoulder 05/31/2024   Pain in shoulder 05/31/2024   Palpitations 05/31/2024   Panic attack 05/31/2024   Plantar fasciitis 05/31/2024   Seizure (HCC) 05/31/2024   Suicidal thoughts 05/31/2024   BP (high blood pressure) 05/31/2024   Temporary cerebral vascular dysfunction 05/31/2024   AB (asthmatic bronchitis) 05/31/2024   Clinical depression 05/31/2024   Chronic migraine without aura,  intractable, without status migrainosus 05/31/2024   Chronic pain syndrome 05/31/2024   Chronic back pain 05/31/2024   Chest pain, unspecified 05/31/2024   Radiculopathy, cervical region 05/31/2024   Cervical radiculopathy 05/31/2024   Cervical disc disorder with radiculopathy, cervicothoracic region 05/31/2024   Cervical disc disease 05/31/2024   Cannot sleep 05/31/2024   Avitaminosis D 05/31/2024   Attention deficit hyperactivity disorder, combined type 05/31/2024   Anogenital herpes simplex virus (HSV) infection 05/31/2024   Allergy 05/31/2024   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 05/31/2024   Accident 05/31/2024   Abnormal results of liver function studies 05/31/2024   Other specified problems related to psychosocial circumstances 05/31/2024   Spiritual or religious counseling 05/31/2024   Encounter for immunization 05/31/2024   Other specified bacterial agents as the cause of diseases classified elsewhere 05/31/2024   Onychomycosis 05/31/2024   Dermatitis, unspecified 05/31/2024   Difficulty in walking, not elsewhere classified 05/31/2024   Difficulty swallowing solids 05/31/2024   Dry eye syndrome of bilateral lacrimal glands 05/31/2024   Dysuria 05/31/2024   Exposure to potentially hazardous substance 05/31/2024   Feeling stressed out 05/31/2024   Fibroid 05/31/2024   Finger injury 05/31/2024   Glossitis 05/31/2024   Headache, migraine 05/31/2024   Hearing loss 05/31/2024   Hive 05/31/2024   Hypercholesteremia 05/31/2024   Mixed hyperlipidemia 05/31/2024   Lumbosacral radiculopathy 05/31/2024   Major depressive disorder, recurrent, moderate (HCC) 05/31/2024   Fatty (change of) liver, not elsewhere classified 05/31/2024   Metabolic dysfunction-associated steatotic liver disease (MASLD) 05/31/2024   Neck pain 05/31/2024   Neurosis, posttraumatic 05/31/2024   Low back pain 05/31/2024   Diabetic polyneuropathy (HCC) 05/31/2024   Obesity 05/31/2024   Observation for  suspected condition 05/31/2024   Diabetic neuropathy (HCC) 05/31/2024   Compulsive tobacco user syndrome 05/31/2024   Collagenous colitis 05/31/2024   Benign positional vertigo 05/31/2024   Pharmacologic  therapy 05/31/2024   Disorder of skeletal system 05/31/2024   Problems influencing health status 05/31/2024   Resolved Ambulatory Problems    Diagnosis Date Noted   No Resolved Ambulatory Problems   Past Medical History:  Diagnosis Date   Anxiety    Asthma    Brown recluse spider bite 2014   Depression    Diabetes (HCC)    Headache    Hypertension    Hypothyroidism    IBS (irritable bowel syndrome)    PTSD (post-traumatic stress disorder)    Constitutional Exam  General appearance: Well nourished, well developed, and well hydrated. In no apparent acute distress There were no vitals filed for this visit. BMI Assessment: Estimated body mass index is 28.73 kg/m as calculated from the following:   Height as of 11/05/15: 5' 6 (1.676 m).   Weight as of 11/05/15: 178 lb (80.7 kg).  BMI interpretation table: BMI level Category Range association with higher incidence of chronic pain  <18 kg/m2 Underweight   18.5-24.9 kg/m2 Ideal body weight   25-29.9 kg/m2 Overweight Increased incidence by 20%  30-34.9 kg/m2 Obese (Class I) Increased incidence by 68%  35-39.9 kg/m2 Severe obesity (Class II) Increased incidence by 136%  >40 kg/m2 Extreme obesity (Class III) Increased incidence by 254%   Patient's current BMI Ideal Body weight  There is no height or weight on file to calculate BMI. Patient weight not recorded   BMI Readings from Last 4 Encounters:  11/05/15 28.73 kg/m  08/20/15 27.92 kg/m  03/13/15 30.85 kg/m   Wt Readings from Last 4 Encounters:  11/05/15 178 lb (80.7 kg)  08/20/15 173 lb (78.5 kg)  03/13/15 187 lb 6.3 oz (85 kg)    Psych/Mental status: Alert, oriented x 3 (person, place, & time)       Eyes: PERLA Respiratory: No evidence of acute respiratory  distress  Assessment  Primary Diagnosis & Pertinent Problem List: The primary encounter diagnosis was Chronic pain syndrome. Diagnoses of Pharmacologic therapy, Disorder of skeletal system, and Problems influencing health status were also pertinent to this visit.  Visit Diagnosis (New problems to examiner): 1. Chronic pain syndrome   2. Pharmacologic therapy   3. Disorder of skeletal system   4. Problems influencing health status    Plan of Care (Initial workup plan)  Note: Ms. Smart was reminded that as per protocol, today's visit has been an evaluation only. We have not taken over the patient's controlled substance management.  Problem-specific plan: Assessment and Plan            Lab Orders  No laboratory test(s) ordered today   Imaging Orders  No imaging studies ordered today   Referral Orders  No referral(s) requested today   Procedure Orders    No procedure(s) ordered today   Pharmacotherapy (current): Medications ordered:  No orders of the defined types were placed in this encounter.  Medications administered during this visit: Margaret IVAR Janit had no medications administered during this visit.   Analgesic Pharmacotherapy:  Opioid Analgesics: For patients currently taking or requesting to take opioid analgesics, in accordance with [place]  Medical Board Guidelines, we will assess their risks and indications for the use of these substances. After completing our evaluation, we may offer recommendations, but we no longer take patients for medication management. The prescribing physician will ultimately decide, based on his/her training and level of comfort whether to adopt any of the recommendations, including whether or not to prescribe such medicines.  Membrane stabilizer: To be  determined at a later time  Muscle relaxant: To be determined at a later time  NSAID: To be determined at a later time  Other analgesic(s): To be determined at a later time    Interventional management options: Ms. Quinonez was informed that there is no guarantee that she would be a candidate for interventional therapies. The decision will be based on the results of diagnostic studies, as well as Ms. Rowe's risk profile.  Procedure(s) under consideration:  Pending results of ordered studies     Interventional Therapies  Risk Factors  Considerations  Medical Comorbidities:     Planned  Pending:      Under consideration:   Pending   Completed: (Analgesic benefit)1  None at this time   Therapeutic  Palliative (PRN) options:   None established   Completed by other providers:   None reported  1(Analgesic benefit): Expressed in percentage (%). (Local anesthetic[LA] +/- sedation  L.A.Local Anesthetic  Steroid benefit  Ongoing benefit)   Provider-requested follow-up: No follow-ups on file.  Future Appointments  Date Time Provider Department Center  06/01/2024  1:00 PM Tanya Glisson, MD [facility] None   I discussed the assessment and treatment plan with the patient. The patient was provided an opportunity to ask questions and all were answered. The patient agreed with the plan and demonstrated an understanding of the instructions.  Patient advised to call back or seek an in-person evaluation if the symptoms or condition worsens.  Duration of encounter: *** minutes.  Total time on encounter, as per AMA guidelines included both the face-to-face and non-face-to-face time personally spent by the physician and/or other qualified health care professional(s) on the day of the encounter (includes time in activities that require the physician or other qualified health care professional and does not include time in activities normally performed by clinical staff). Physician's time may include the following activities when performed: Preparing to see the patient (e.g., pre-charting review of records, searching for previously ordered imaging, lab work, and  nerve conduction tests) Review of prior analgesic pharmacotherapies. Reviewing PMP Interpreting ordered tests (e.g., lab work, imaging, nerve conduction tests) Performing post-procedure evaluations, including interpretation of diagnostic procedures Obtaining and/or reviewing separately obtained history Performing a medically appropriate examination and/or evaluation Counseling and educating the patient/family/caregiver Ordering medications, tests, or procedures Referring and communicating with other health care professionals (when not separately reported) Documenting clinical information in the electronic or other health record Independently interpreting results (not separately reported) and communicating results to the patient/ family/caregiver Care coordination (not separately reported)  Note by: Glisson DELENA Tanya, MD (TTS and AI technology used. I apologize for any typographical errors that were not detected and corrected.) Date: 06/01/2024; Time: 9:45 PM    [1]  Current Outpatient Medications:    albuterol  (PROVENTIL  HFA;VENTOLIN  HFA) 108 (90 BASE) MCG/ACT inhaler, Inhale 2 puffs into the lungs every 4 (four) hours as needed for wheezing or shortness of breath., Disp: , Rfl:    albuterol -ipratropium (COMBIVENT) 18-103 MCG/ACT inhaler, Inhale 1 puff into the lungs every 6 (six) hours as needed for wheezing or shortness of breath., Disp: 3 Inhaler, Rfl: 1   atorvastatin (LIPITOR) 40 MG tablet, Take 40 mg by mouth daily. , Disp: , Rfl:    betamethasone  valerate (VALISONE ) 0.1 % cream, Apply topically daily as needed., Disp: 360 g, Rfl: 1   busPIRone (BUSPAR) 10 MG tablet, Take 20 mg by mouth 2 (two) times daily. , Disp: , Rfl:    carboxymethylcellulose (REFRESH PLUS) 0.5 % SOLN,  Place 1 drop into both eyes 4 (four) times daily. , Disp: , Rfl:    Cholecalciferol 1000 units tablet, Take 1,000 Units by mouth 2 (two) times daily., Disp: , Rfl:    clotrimazole (LOTRIMIN) 1 % cream, Apply 1  application topically 2 (two) times daily as needed (rash). , Disp: , Rfl:    EPINEPHrine  (EPIPEN  2-PAK) 0.3 mg/0.3 mL IJ SOAJ injection, Inject 0.3 mLs (0.3 mg total) into the muscle as needed., Disp: 2 Device, Rfl: 1   estradiol (CLIMARA - DOSED IN MG/24 HR) 0.0375 mg/24hr patch, Place 0.0375 mg onto the skin 2 (two) times a week. Monday and Thursday, Disp: , Rfl:    fluticasone (VERAMYST) 27.5 MCG/SPRAY nasal spray, Place 2 sprays into both nostrils daily. , Disp: , Rfl:    glipiZIDE (GLUCOTROL) 5 MG tablet, Take 5 mg by mouth 2 (two) times daily before a meal., Disp: , Rfl:    GLUCERNA (GLUCERNA) LIQD, Take 1 Can by mouth 2 (two) times daily between meals., Disp: , Rfl:    hydroxypropyl methylcellulose / hypromellose (ISOPTO TEARS / GONIOVISC) 2.5 % ophthalmic solution, Place 1 drop into both eyes 4 (four) times daily as needed for dry eyes., Disp: , Rfl:    hydrOXYzine (ATARAX/VISTARIL) 50 MG tablet, Take 50 mg by mouth every 4 (four) hours as needed for itching. , Disp: , Rfl:    insulin detemir (LEVEMIR) 100 UNIT/ML injection, Inject 15 Units into the skin daily., Disp: , Rfl:    levothyroxine (SYNTHROID, LEVOTHROID) 50 MCG tablet, Take 1 tablet (50 mcg total) by mouth daily before breakfast. Pt needs to schedule an office visit before anymore refills., Disp: 30 tablet, Rfl: 0   lisinopril (PRINIVIL,ZESTRIL) 10 MG tablet, Take 5 mg by mouth every evening., Disp: , Rfl:    loratadine  (CLARITIN ) 10 MG tablet, Take 1 tablet (10 mg total) by mouth daily as needed for allergies. (Patient taking differently: Take 10 mg by mouth daily. ), Disp: 90 tablet, Rfl: 1   meloxicam  (MOBIC ) 15 MG tablet, Take 1 tablet (15 mg total) by mouth daily. QDPC, Disp: 30 tablet, Rfl: 0   methocarbamol (ROBAXIN) 750 MG tablet, Take 750 mg by mouth every 6 (six) hours as needed for muscle spasms., Disp: , Rfl:    mometasone  (ASMANEX  60 METERED DOSES) 220 MCG/INH inhaler, Inhale 2 puffs into the lungs as needed., Disp: 3  Inhaler, Rfl: 1   mometasone  (ASMANEX ) 220 MCG/INH inhaler, Inhale 2 puffs into the lungs at bedtime. , Disp: , Rfl:    montelukast  (SINGULAIR ) 10 MG tablet, Take 1 tablet (10 mg total) by mouth at bedtime., Disp: 90 tablet, Rfl: 1   Multiple Vitamins-Minerals (MULTIVITAMIN WITH MINERALS) tablet, Take 1 tablet by mouth every morning., Disp: , Rfl:    Olopatadine  HCl (PATADAY ) 0.2 % SOLN, Place 1 drop into both eyes 1 day or 1 dose., Disp: 3 Bottle, Rfl: 1   omeprazole (PRILOSEC) 20 MG capsule, Take 40 mg by mouth at bedtime. , Disp: , Rfl:    ondansetron  (ZOFRAN ) 4 MG tablet, Take 8 mg by mouth every 6 (six) hours as needed for nausea or vomiting. , Disp: , Rfl:    oxyCODONE -acetaminophen  (PERCOCET) 5-325 MG tablet, Take 1 tablet by mouth every 4 (four) hours as needed for moderate pain or severe pain., Disp: 30 tablet, Rfl: 0   potassium chloride SA (K-DUR,KLOR-CON) 20 MEQ tablet, Take 20 mEq by mouth every morning., Disp: , Rfl:    pregabalin (LYRICA) 200 MG capsule,  Take 200 mg by mouth 3 (three) times daily. , Disp: , Rfl:    SUMAtriptan (IMITREX) 25 MG tablet, Take 25 mg by mouth every 2 (two) hours as needed for migraine. May repeat in 2 hours if headache persists or recurs., Disp: , Rfl:    topiramate (TOPAMAX) 100 MG tablet, Take 100 mg by mouth at bedtime. , Disp: , Rfl:    topiramate (TOPAMAX) 200 MG tablet, Take 200 mg by mouth every morning. , Disp: , Rfl:    witch hazel-glycerin (TUCKS) pad, Apply 1 application topically as needed for itching., Disp: , Rfl:

## 2024-05-31 NOTE — Patient Instructions (Incomplete)

## 2024-06-01 ENCOUNTER — Ambulatory Visit
Admission: RE | Admit: 2024-06-01 | Discharge: 2024-06-01 | Disposition: A | Discharge status: Home / Self Care | Source: Ambulatory Visit | Attending: Pain Medicine | Admitting: Pain Medicine

## 2024-06-01 ENCOUNTER — Ambulatory Visit: Admitting: Pain Medicine

## 2024-06-01 ENCOUNTER — Encounter: Payer: Self-pay | Admitting: Pain Medicine

## 2024-06-01 VITALS — BP 128/79 | HR 84 | Temp 97.6°F | Ht 65.0 in | Wt 193.0 lb

## 2024-06-01 DIAGNOSIS — G8929 Other chronic pain: Secondary | ICD-10-CM | POA: Insufficient documentation

## 2024-06-01 DIAGNOSIS — M5441 Lumbago with sciatica, right side: Secondary | ICD-10-CM | POA: Insufficient documentation

## 2024-06-01 DIAGNOSIS — M545 Low back pain, unspecified: Secondary | ICD-10-CM | POA: Insufficient documentation

## 2024-06-01 DIAGNOSIS — M5090 Cervical disc disorder, unspecified, unspecified cervical region: Secondary | ICD-10-CM

## 2024-06-01 DIAGNOSIS — Z79899 Other long term (current) drug therapy: Secondary | ICD-10-CM | POA: Diagnosis present

## 2024-06-01 DIAGNOSIS — M79602 Pain in left arm: Secondary | ICD-10-CM | POA: Diagnosis not present

## 2024-06-01 DIAGNOSIS — M899 Disorder of bone, unspecified: Secondary | ICD-10-CM | POA: Diagnosis present

## 2024-06-01 DIAGNOSIS — Z789 Other specified health status: Secondary | ICD-10-CM

## 2024-06-01 DIAGNOSIS — M5417 Radiculopathy, lumbosacral region: Secondary | ICD-10-CM | POA: Diagnosis present

## 2024-06-01 DIAGNOSIS — G894 Chronic pain syndrome: Secondary | ICD-10-CM | POA: Insufficient documentation

## 2024-06-01 DIAGNOSIS — M79601 Pain in right arm: Secondary | ICD-10-CM | POA: Insufficient documentation

## 2024-06-01 DIAGNOSIS — G8928 Other chronic postprocedural pain: Secondary | ICD-10-CM | POA: Diagnosis present

## 2024-06-01 DIAGNOSIS — M79605 Pain in left leg: Secondary | ICD-10-CM | POA: Diagnosis not present

## 2024-06-01 DIAGNOSIS — R937 Abnormal findings on diagnostic imaging of other parts of musculoskeletal system: Secondary | ICD-10-CM

## 2024-06-01 DIAGNOSIS — R519 Headache, unspecified: Secondary | ICD-10-CM | POA: Insufficient documentation

## 2024-06-01 DIAGNOSIS — Z9889 Other specified postprocedural states: Secondary | ICD-10-CM | POA: Diagnosis present

## 2024-06-01 DIAGNOSIS — M542 Cervicalgia: Secondary | ICD-10-CM | POA: Insufficient documentation

## 2024-06-01 DIAGNOSIS — M79604 Pain in right leg: Secondary | ICD-10-CM | POA: Diagnosis present

## 2024-06-01 DIAGNOSIS — M5412 Radiculopathy, cervical region: Secondary | ICD-10-CM | POA: Insufficient documentation

## 2024-06-01 DIAGNOSIS — E119 Type 2 diabetes mellitus without complications: Secondary | ICD-10-CM

## 2024-06-01 DIAGNOSIS — G4486 Cervicogenic headache: Secondary | ICD-10-CM | POA: Diagnosis not present

## 2024-06-01 DIAGNOSIS — Z794 Long term (current) use of insulin: Secondary | ICD-10-CM | POA: Insufficient documentation

## 2024-06-01 NOTE — Progress Notes (Signed)
 Safety precautions to be maintained throughout the outpatient stay will include: orient to surroundings, keep bed in low position, maintain call bell within reach at all times, provide assistance with transfer out of bed and ambulation.

## 2024-06-02 LAB — SPECIMEN STATUS REPORT

## 2024-06-07 LAB — COMP. METABOLIC PANEL (12)
AST: 25 [IU]/L (ref 0–40)
Albumin: 4.3 g/dL (ref 3.9–4.9)
Alkaline Phosphatase: 160 [IU]/L — ABNORMAL HIGH (ref 49–135)
BUN/Creatinine Ratio: 13 (ref 12–28)
BUN: 14 mg/dL (ref 8–27)
Bilirubin Total: 0.2 mg/dL (ref 0.0–1.2)
Calcium: 10.1 mg/dL (ref 8.7–10.3)
Chloride: 101 mmol/L (ref 96–106)
Creatinine, Ser: 1.08 mg/dL — ABNORMAL HIGH (ref 0.57–1.00)
Globulin, Total: 2.7 g/dL (ref 1.5–4.5)
Glucose: 156 mg/dL — ABNORMAL HIGH (ref 70–99)
Potassium: 5.2 mmol/L (ref 3.5–5.2)
Sodium: 139 mmol/L (ref 134–144)
Total Protein: 7 g/dL (ref 6.0–8.5)
eGFR: 58 mL/min/{1.73_m2} — ABNORMAL LOW

## 2024-06-07 LAB — 25-HYDROXY VITAMIN D LCMS D2+D3
25-Hydroxy, Vitamin D-2: 1 ng/mL
25-Hydroxy, Vitamin D-3: 34 ng/mL
25-Hydroxy, Vitamin D: 35 ng/mL

## 2024-06-07 LAB — VITAMIN B12: Vitamin B-12: 668 pg/mL (ref 232–1245)

## 2024-06-07 LAB — HEMOGLOBIN A1C
Est. average glucose Bld gHb Est-mCnc: 217 mg/dL
Hgb A1c MFr Bld: 9.2 % — ABNORMAL HIGH (ref 4.8–5.6)

## 2024-06-07 LAB — MAGNESIUM: Magnesium: 1.8 mg/dL (ref 1.6–2.3)

## 2024-06-07 LAB — SEDIMENTATION RATE: Sed Rate: 38 mm/h (ref 0–40)

## 2024-06-07 LAB — C-REACTIVE PROTEIN: CRP: 2 mg/L (ref 0–10)

## 2024-06-08 ENCOUNTER — Encounter: Payer: Self-pay | Admitting: Pain Medicine

## 2024-06-08 DIAGNOSIS — R892 Abnormal level of other drugs, medicaments and biological substances in specimens from other organs, systems and tissues: Secondary | ICD-10-CM | POA: Insufficient documentation

## 2024-06-08 LAB — COMPLIANCE DRUG ANALYSIS, UR

## 2024-06-22 ENCOUNTER — Ambulatory Visit: Admitting: Pain Medicine
